# Patient Record
Sex: Male | Born: 1935 | Race: White | Hispanic: No | Marital: Married | Smoking: Former smoker
Health system: Southern US, Community
[De-identification: ages and names within clinical notes are randomized; demographics above are authoritative.]

## PROBLEM LIST (undated history)

## (undated) DIAGNOSIS — G20A1 Parkinson's disease without dyskinesia, without mention of fluctuations: Secondary | ICD-10-CM

## (undated) DIAGNOSIS — E079 Disorder of thyroid, unspecified: Secondary | ICD-10-CM

## (undated) DIAGNOSIS — G20 Parkinson's disease: Secondary | ICD-10-CM

## (undated) DIAGNOSIS — F0390 Unspecified dementia without behavioral disturbance: Secondary | ICD-10-CM

## (undated) HISTORY — DX: Unspecified dementia, unspecified severity, without behavioral disturbance, psychotic disturbance, mood disturbance, and anxiety: F03.90

## (undated) HISTORY — DX: Disorder of thyroid, unspecified: E07.9

---

## 1998-01-12 ENCOUNTER — Other Ambulatory Visit: Admission: RE | Admit: 1998-01-12 | Discharge: 1998-01-12 | Payer: Self-pay | Admitting: *Deleted

## 2000-02-23 ENCOUNTER — Other Ambulatory Visit: Admission: RE | Admit: 2000-02-23 | Discharge: 2000-02-23 | Payer: Self-pay | Admitting: Urology

## 2000-03-08 ENCOUNTER — Encounter: Admission: RE | Admit: 2000-03-08 | Discharge: 2000-03-08 | Payer: Self-pay | Admitting: Urology

## 2000-03-08 ENCOUNTER — Encounter: Payer: Self-pay | Admitting: Urology

## 2000-03-14 ENCOUNTER — Encounter: Payer: Self-pay | Admitting: Urology

## 2000-03-14 ENCOUNTER — Encounter: Admission: RE | Admit: 2000-03-14 | Discharge: 2000-03-14 | Payer: Self-pay | Admitting: Urology

## 2000-04-04 ENCOUNTER — Encounter: Admission: RE | Admit: 2000-04-04 | Discharge: 2000-07-03 | Payer: Self-pay | Admitting: Radiation Oncology

## 2000-07-04 ENCOUNTER — Encounter: Admission: RE | Admit: 2000-07-04 | Discharge: 2000-10-02 | Payer: Self-pay | Admitting: Radiation Oncology

## 2002-03-31 ENCOUNTER — Inpatient Hospital Stay (HOSPITAL_COMMUNITY): Admission: AD | Admit: 2002-03-31 | Discharge: 2002-04-01 | Payer: Self-pay | Admitting: Internal Medicine

## 2005-01-11 ENCOUNTER — Ambulatory Visit: Payer: Self-pay | Admitting: Internal Medicine

## 2005-05-11 ENCOUNTER — Ambulatory Visit: Payer: Self-pay | Admitting: Internal Medicine

## 2005-05-17 ENCOUNTER — Ambulatory Visit: Payer: Self-pay | Admitting: Internal Medicine

## 2005-10-25 ENCOUNTER — Ambulatory Visit: Payer: Self-pay | Admitting: Internal Medicine

## 2005-10-26 ENCOUNTER — Encounter: Admission: RE | Admit: 2005-10-26 | Discharge: 2005-10-26 | Payer: Self-pay | Admitting: Internal Medicine

## 2006-05-01 ENCOUNTER — Ambulatory Visit: Payer: Self-pay | Admitting: Internal Medicine

## 2006-09-03 ENCOUNTER — Ambulatory Visit: Payer: Self-pay | Admitting: Internal Medicine

## 2006-09-04 ENCOUNTER — Ambulatory Visit (HOSPITAL_BASED_OUTPATIENT_CLINIC_OR_DEPARTMENT_OTHER): Admission: RE | Admit: 2006-09-04 | Discharge: 2006-09-04 | Payer: Self-pay | Admitting: Internal Medicine

## 2006-09-09 ENCOUNTER — Ambulatory Visit: Payer: Self-pay | Admitting: Internal Medicine

## 2006-09-17 ENCOUNTER — Ambulatory Visit: Payer: Self-pay | Admitting: Internal Medicine

## 2006-09-27 ENCOUNTER — Ambulatory Visit: Payer: Self-pay | Admitting: Internal Medicine

## 2006-09-28 ENCOUNTER — Ambulatory Visit: Payer: Self-pay | Admitting: Internal Medicine

## 2006-09-28 LAB — CONVERTED CEMR LAB
BUN: 9 mg/dL (ref 6–23)
Basophils Absolute: 0 10*3/uL (ref 0.0–0.1)
CO2: 33 meq/L — ABNORMAL HIGH (ref 19–32)
Calcium: 9.3 mg/dL (ref 8.4–10.5)
Chloride: 103 meq/L (ref 96–112)
Creatinine, Ser: 1.2 mg/dL (ref 0.4–1.5)
Folate: 5.7 ng/mL
HCT: 42.7 % (ref 39.0–52.0)
Hemoglobin: 14.9 g/dL (ref 13.0–17.0)
MCHC: 35 g/dL (ref 30.0–36.0)
Monocytes Relative: 5.9 % (ref 3.0–11.0)
Neutro Abs: 3.7 10*3/uL (ref 1.4–7.7)
Potassium: 3.8 meq/L (ref 3.5–5.1)
RBC: 4.62 M/uL (ref 4.22–5.81)
RDW: 12.4 % (ref 11.5–14.6)

## 2006-10-02 ENCOUNTER — Ambulatory Visit: Payer: Self-pay | Admitting: Internal Medicine

## 2006-10-10 ENCOUNTER — Inpatient Hospital Stay (HOSPITAL_COMMUNITY): Admission: EM | Admit: 2006-10-10 | Discharge: 2006-10-17 | Payer: Self-pay | Admitting: Emergency Medicine

## 2006-10-11 ENCOUNTER — Ambulatory Visit: Payer: Self-pay | Admitting: Internal Medicine

## 2010-09-04 ENCOUNTER — Encounter: Payer: Self-pay | Admitting: Internal Medicine

## 2010-11-05 ENCOUNTER — Emergency Department (HOSPITAL_COMMUNITY)
Admission: EM | Admit: 2010-11-05 | Discharge: 2010-11-05 | Disposition: A | Discharge status: Home / Self Care | Payer: Medicare Other | Attending: Emergency Medicine | Admitting: Emergency Medicine

## 2010-11-05 DIAGNOSIS — Y9210 Unspecified residential institution as the place of occurrence of the external cause: Secondary | ICD-10-CM | POA: Insufficient documentation

## 2010-11-05 DIAGNOSIS — Z139 Encounter for screening, unspecified: Secondary | ICD-10-CM | POA: Insufficient documentation

## 2010-11-05 DIAGNOSIS — M1990 Unspecified osteoarthritis, unspecified site: Secondary | ICD-10-CM | POA: Insufficient documentation

## 2010-11-05 DIAGNOSIS — W06XXXA Fall from bed, initial encounter: Secondary | ICD-10-CM | POA: Insufficient documentation

## 2010-11-05 DIAGNOSIS — G309 Alzheimer's disease, unspecified: Secondary | ICD-10-CM | POA: Insufficient documentation

## 2010-11-05 DIAGNOSIS — G3183 Dementia with Lewy bodies: Secondary | ICD-10-CM | POA: Insufficient documentation

## 2010-11-05 DIAGNOSIS — E039 Hypothyroidism, unspecified: Secondary | ICD-10-CM | POA: Insufficient documentation

## 2010-11-05 DIAGNOSIS — F3289 Other specified depressive episodes: Secondary | ICD-10-CM | POA: Insufficient documentation

## 2010-11-05 DIAGNOSIS — F329 Major depressive disorder, single episode, unspecified: Secondary | ICD-10-CM | POA: Insufficient documentation

## 2010-11-05 DIAGNOSIS — F0280 Dementia in other diseases classified elsewhere without behavioral disturbance: Secondary | ICD-10-CM | POA: Insufficient documentation

## 2010-11-05 DIAGNOSIS — C61 Malignant neoplasm of prostate: Secondary | ICD-10-CM | POA: Insufficient documentation

## 2010-11-05 DIAGNOSIS — I10 Essential (primary) hypertension: Secondary | ICD-10-CM | POA: Insufficient documentation

## 2011-05-27 ENCOUNTER — Emergency Department (HOSPITAL_COMMUNITY)
Admission: EM | Admit: 2011-05-27 | Discharge: 2011-05-28 | Disposition: A | Discharge status: Home / Self Care | Payer: Medicare Other | Attending: Emergency Medicine | Admitting: Emergency Medicine

## 2011-05-27 DIAGNOSIS — I10 Essential (primary) hypertension: Secondary | ICD-10-CM | POA: Insufficient documentation

## 2011-05-27 DIAGNOSIS — L0291 Cutaneous abscess, unspecified: Secondary | ICD-10-CM | POA: Insufficient documentation

## 2011-05-27 DIAGNOSIS — L0390 Cellulitis, unspecified: Secondary | ICD-10-CM | POA: Insufficient documentation

## 2011-05-27 DIAGNOSIS — G309 Alzheimer's disease, unspecified: Secondary | ICD-10-CM | POA: Insufficient documentation

## 2011-05-27 DIAGNOSIS — Z8546 Personal history of malignant neoplasm of prostate: Secondary | ICD-10-CM | POA: Insufficient documentation

## 2011-05-27 DIAGNOSIS — S82899A Other fracture of unspecified lower leg, initial encounter for closed fracture: Secondary | ICD-10-CM | POA: Insufficient documentation

## 2011-05-27 DIAGNOSIS — F0280 Dementia in other diseases classified elsewhere without behavioral disturbance: Secondary | ICD-10-CM | POA: Insufficient documentation

## 2011-05-27 DIAGNOSIS — E039 Hypothyroidism, unspecified: Secondary | ICD-10-CM | POA: Insufficient documentation

## 2011-05-27 DIAGNOSIS — X58XXXA Exposure to other specified factors, initial encounter: Secondary | ICD-10-CM | POA: Insufficient documentation

## 2011-05-27 DIAGNOSIS — Z66 Do not resuscitate: Secondary | ICD-10-CM | POA: Insufficient documentation

## 2011-05-27 DIAGNOSIS — G3183 Dementia with Lewy bodies: Secondary | ICD-10-CM | POA: Insufficient documentation

## 2011-05-27 DIAGNOSIS — Y9210 Unspecified residential institution as the place of occurrence of the external cause: Secondary | ICD-10-CM | POA: Insufficient documentation

## 2011-05-27 LAB — BASIC METABOLIC PANEL
Chloride: 99 mEq/L (ref 96–112)
Creatinine, Ser: 0.79 mg/dL (ref 0.50–1.35)
GFR calc Af Amer: >90 mL/min (ref >90)
GFR calc non Af Amer: 80 mL/min — ABNORMAL LOW (ref >90)
Sodium: 136 mEq/L (ref 135–145)

## 2011-05-27 LAB — DIFFERENTIAL
Eosinophils Relative: 2 % (ref 0–5)
Lymphs Abs: 2.1 10*3/uL (ref 0.7–4.0)
Monocytes Absolute: 0.6 10*3/uL (ref 0.1–1.0)
Neutro Abs: 4.1 10*3/uL (ref 1.7–7.7)

## 2011-05-27 LAB — CBC
MCH: 31 pg (ref 26.0–34.0)
RBC: 4.36 MIL/uL (ref 4.22–5.81)
RDW: 13.1 % (ref 11.5–15.5)
WBC: 7 10*3/uL (ref 4.0–10.5)

## 2012-03-21 SURGERY — Surgical Case
Anesthesia: *Unknown

## 2012-12-02 ENCOUNTER — Non-Acute Institutional Stay (SKILLED_NURSING_FACILITY): Payer: Medicare Other | Admitting: Internal Medicine

## 2012-12-02 DIAGNOSIS — K5900 Constipation, unspecified: Secondary | ICD-10-CM

## 2012-12-02 DIAGNOSIS — F0390 Unspecified dementia without behavioral disturbance: Secondary | ICD-10-CM

## 2012-12-02 DIAGNOSIS — E039 Hypothyroidism, unspecified: Secondary | ICD-10-CM

## 2012-12-02 DIAGNOSIS — G20 Parkinson's disease: Secondary | ICD-10-CM

## 2012-12-12 NOTE — Progress Notes (Signed)
PROGRESS NOTE  DATE: 12-02-12  FACILITY: [facility]  LEVEL OF CARE: SNF  Routine Visit  CHIEF COMPLAINT:  Manage hypothyroidism and dementia  HISTORY OF PRESENT ILLNESS:  REASSESSMENT OF ONGOING PROBLEM(S):  1. HYPOTHYROIDISM: The hypothyroidism remains stable. No complications noted from the medications presently being used.  The staff deny fatigue or constipation.  Last TSH 15.78 in 4/14.  2. DEMENTIA: The dementia remaines stable and continues to function adequately in the current living environment with supervision.  The patient has had little changes in behavior. No complications noted from the medications presently being used. Patient is a poor historian.  PAST MEDICAL HISTORY : Reviewed.  No changes.  CURRENT MEDICATIONS: Reviewed per [facility]  REVIEW OF SYSTEMS: Unobtainable secondary to dementia.  PHYSICAL EXAMINATION  VS:  T 98       P      RR 18      BP 146/72     POX %     WT (Lb) 164  GENERAL: no acute distress, normal body habitus NECK: supple, trachea midline, no neck masses, no thyroid tenderness, no thyromegaly RESPIRATORY: breathing is even & unlabored, BS CTAB CARDIAC: RRR, no murmur,no extra heart sounds, no edema GI: abdomen soft, normal BS, no masses, no tenderness, no hepatomegaly, no splenomegaly PSYCHIATRIC: the patient is alert & disoriented, affect & behavior appropriate  LABS/RADIOLOGY:  4/14 BMP normal, CBC normal, liver profile normal, Depakote 28  ASSESSMENT/PLAN:  1. hypothyroidism-uncontrolled. synthroid was increased. Recheck TSH is pending. 2. dementia-advanced. 3. Parkinson's disease-continue Sinemet. 4. constipation-well-controlled. 5. depression-continue current antidepressant.   CPT CODE: 81191

## 2012-12-23 ENCOUNTER — Non-Acute Institutional Stay (SKILLED_NURSING_FACILITY): Payer: Medicare Other | Admitting: Internal Medicine

## 2012-12-23 DIAGNOSIS — G20 Parkinson's disease: Secondary | ICD-10-CM | POA: Insufficient documentation

## 2012-12-23 DIAGNOSIS — K5900 Constipation, unspecified: Secondary | ICD-10-CM

## 2012-12-23 DIAGNOSIS — G309 Alzheimer's disease, unspecified: Secondary | ICD-10-CM | POA: Insufficient documentation

## 2012-12-23 DIAGNOSIS — E039 Hypothyroidism, unspecified: Secondary | ICD-10-CM | POA: Insufficient documentation

## 2012-12-23 DIAGNOSIS — G20A1 Parkinson's disease without dyskinesia, without mention of fluctuations: Secondary | ICD-10-CM | POA: Insufficient documentation

## 2012-12-23 NOTE — Progress Notes (Signed)
PROGRESS NOTE  DATE: 12-23-12  FACILITY: [facility]  LEVEL OF CARE: SNF  Routine Visit  CHIEF COMPLAINT:  Manage hypothyroidism and dementia  HISTORY OF PRESENT ILLNESS:  REASSESSMENT OF ONGOING PROBLEM(S):  1. HYPOTHYROIDISM: The hypothyroidism remains stable. No complications noted from the medications presently being used.  The staff deny fatigue or constipation.  Last TSH 15.78 in 4/14.  2. DEMENTIA: The dementia remaines stable and continues to function adequately in the current living environment with supervision.  The patient has had little changes in behavior. No complications noted from the medications presently being used. Patient is a poor historian.  PAST MEDICAL HISTORY : Reviewed.  No changes.  CURRENT MEDICATIONS: Reviewed per [facility]  REVIEW OF SYSTEMS: Unobtainable secondary to dementia.  PHYSICAL EXAMINATION  VS:  T 97.3       P71      RR 18      BP 152/58     POX %     WT (Lb) 165  GENERAL: no acute distress, normal body habitus NECK: supple, trachea midline, no neck masses, no thyroid tenderness, no thyromegaly RESPIRATORY: breathing is even & unlabored, BS CTAB CARDIAC: RRR, no murmur,no extra heart sounds, no edema GI: abdomen soft, normal BS, no masses, no tenderness, no hepatomegaly, no splenomegaly PSYCHIATRIC: the patient is alert & disoriented, affect & behavior appropriate  LABS/RADIOLOGY:  4/14 BMP normal, CBC normal, liver profile normal, Depakote 28  ASSESSMENT/PLAN:  1. hypothyroidism-uncontrolled. synthroid was increased. Recheck TSH is pending. 2. dementia-advanced. 3. Parkinson's disease-continue Sinemet. 4. constipation-well-controlled. 5. depression-continue current antidepressant. 6. elevated blood pressure-we'll review of BP log.   CPT CODE: 04540

## 2013-01-20 ENCOUNTER — Non-Acute Institutional Stay (SKILLED_NURSING_FACILITY): Payer: Medicare Other | Admitting: Internal Medicine

## 2013-01-20 DIAGNOSIS — G20 Parkinson's disease: Secondary | ICD-10-CM

## 2013-01-20 DIAGNOSIS — K5900 Constipation, unspecified: Secondary | ICD-10-CM

## 2013-01-20 DIAGNOSIS — G309 Alzheimer's disease, unspecified: Secondary | ICD-10-CM

## 2013-01-20 DIAGNOSIS — E039 Hypothyroidism, unspecified: Secondary | ICD-10-CM

## 2013-01-20 DIAGNOSIS — F0280 Dementia in other diseases classified elsewhere without behavioral disturbance: Secondary | ICD-10-CM

## 2013-01-23 NOTE — Progress Notes (Signed)
PROGRESS NOTE  DATE: 01-20-13  FACILITY: [facility]  LEVEL OF CARE: SNF  Routine Visit  CHIEF COMPLAINT:  Manage hypothyroidism and dementia  HISTORY OF PRESENT ILLNESS:  REASSESSMENT OF ONGOING PROBLEM(S):  1. HYPOTHYROIDISM: The hypothyroidism remains stable. No complications noted from the medications presently being used.  The staff deny fatigue or constipation.  Last TSH 15.78 in 4/14, in 5/14 TSH 18.21.  2. DEMENTIA: The dementia remaines stable and continues to function adequately in the current living environment with supervision.  The patient has had little changes in behavior. No complications noted from the medications presently being used. Patient is a poor historian.  PAST MEDICAL HISTORY : Reviewed.  No changes.  CURRENT MEDICATIONS: Reviewed per [facility]  REVIEW OF SYSTEMS: Unobtainable secondary to dementia.  PHYSICAL EXAMINATION  VS:  T 98.7.       P 84      RR 18      BP 120/70    POX %     WT (Lb) 160  GENERAL: no acute distress, normal body habitus NECK: supple, trachea midline, no neck masses, no thyroid tenderness, no thyromegaly RESPIRATORY: breathing is even & unlabored, BS CTAB CARDIAC: RRR, no murmur,no extra heart sounds, no edema GI: abdomen soft, normal BS, no masses, no tenderness, no hepatomegaly, no splenomegaly PSYCHIATRIC: the patient is alert & disoriented, affect & behavior appropriate  LABS/RADIOLOGY:  4/14 BMP normal, CBC normal, liver profile normal, Depakote 28  ASSESSMENT/PLAN:  1. hypothyroidism-uncontrolled. synthroid was increased. Recheck TSH is pending. 2. dementia-advanced. 3. Parkinson's disease-continue Sinemet. 4. constipation-well-controlled. 5. depression-continue current antidepressant.   CPT CODE: 16109

## 2013-03-05 ENCOUNTER — Non-Acute Institutional Stay (SKILLED_NURSING_FACILITY): Payer: Medicare Other | Admitting: Internal Medicine

## 2013-03-05 DIAGNOSIS — K5900 Constipation, unspecified: Secondary | ICD-10-CM

## 2013-03-05 DIAGNOSIS — G309 Alzheimer's disease, unspecified: Secondary | ICD-10-CM

## 2013-03-05 DIAGNOSIS — F0280 Dementia in other diseases classified elsewhere without behavioral disturbance: Secondary | ICD-10-CM

## 2013-03-05 DIAGNOSIS — G20 Parkinson's disease: Secondary | ICD-10-CM

## 2013-03-05 DIAGNOSIS — E039 Hypothyroidism, unspecified: Secondary | ICD-10-CM

## 2013-03-05 NOTE — Progress Notes (Signed)
PROGRESS NOTE  DATE: 03-05-13  FACILITY: [facility]  LEVEL OF CARE: SNF  Routine Visit  CHIEF COMPLAINT:  Manage hypothyroidism and dementia  HISTORY OF PRESENT ILLNESS:  REASSESSMENT OF ONGOING PROBLEM(S):  HYPOTHYROIDISM: The hypothyroidism remains stable. No complications noted from the medications presently being used.  The staff deny fatigue or constipation.  Last TSH 15.78 in 4/14, in 5/14 TSH 18.21.  DEMENTIA: The dementia remaines stable and continues to function adequately in the current living environment with supervision.  The patient has had little changes in behavior. No complications noted from the medications presently being used. Patient is a poor historian.  PAST MEDICAL HISTORY : Reviewed.  No changes.  CURRENT MEDICATIONS: Reviewed per [facility]  REVIEW OF SYSTEMS: Unobtainable secondary to dementia.  PHYSICAL EXAMINATION  VS:  T 98.      P 78      RR 20      BP 120/60    POX % 97    WT (Lb) 163  GENERAL: no acute distress, normal body habitus NECK: supple, trachea midline, no neck masses, no thyroid tenderness, no thyromegaly RESPIRATORY: breathing is even & unlabored, BS CTAB CARDIAC: RRR, no murmur,no extra heart sounds, no edema GI: abdomen soft, normal BS, no masses, no tenderness, no hepatomegaly, no splenomegaly PSYCHIATRIC: the patient is alert & disoriented, affect & behavior appropriate  LABS/RADIOLOGY:  4/14 BMP normal, CBC normal, liver profile normal, Depakote 28  ASSESSMENT/PLAN:  1. hypothyroidism-uncontrolled. synthroid was increased. Recheck TSH. 2. dementia-advanced. 3. Parkinson's disease-continue Sinemet. 4. constipation-well-controlled. 5. depression-continue current antidepressant.   CPT CODE: 16109

## 2013-03-26 ENCOUNTER — Non-Acute Institutional Stay (SKILLED_NURSING_FACILITY): Payer: Medicare Other | Admitting: Internal Medicine

## 2013-03-26 DIAGNOSIS — E039 Hypothyroidism, unspecified: Secondary | ICD-10-CM

## 2013-03-26 DIAGNOSIS — K5900 Constipation, unspecified: Secondary | ICD-10-CM

## 2013-03-26 DIAGNOSIS — F0280 Dementia in other diseases classified elsewhere without behavioral disturbance: Secondary | ICD-10-CM

## 2013-03-26 DIAGNOSIS — G309 Alzheimer's disease, unspecified: Secondary | ICD-10-CM

## 2013-03-26 DIAGNOSIS — G20 Parkinson's disease: Secondary | ICD-10-CM

## 2013-03-26 DIAGNOSIS — G20A1 Parkinson's disease without dyskinesia, without mention of fluctuations: Secondary | ICD-10-CM

## 2013-03-28 NOTE — Progress Notes (Signed)
PROGRESS NOTE  DATE: 03-26-13  FACILITY: [facility]  LEVEL OF CARE: SNF  Routine Visit  CHIEF COMPLAINT:  Manage hypothyroidism and dementia  HISTORY OF PRESENT ILLNESS:  REASSESSMENT OF ONGOING PROBLEM(S):  HYPOTHYROIDISM: The hypothyroidism remains stable. No complications noted from the medications presently being used.  The staff deny fatigue or constipation.  Last TSH 15.78 in 4/14, in 5/14 TSH 18.21, in 7/14 TSH 87.44  DEMENTIA: The dementia remaines stable and continues to function adequately in the current living environment with supervision.  The patient has had little changes in behavior. No complications noted from the medications presently being used. Patient is a poor historian.  PAST MEDICAL HISTORY : Reviewed.  No changes.  CURRENT MEDICATIONS: Reviewed per [facility]  REVIEW OF SYSTEMS: Unobtainable secondary to dementia.  PHYSICAL EXAMINATION  VS:  T 98.4      P 70      RR 18      BP 104/64    POX    WT (Lb) 158  GENERAL: no acute distress, normal body habitus NECK: supple, trachea midline, no neck masses, no thyroid tenderness, no thyromegaly RESPIRATORY: breathing is even & unlabored, BS CTAB CARDIAC: RRR, no murmur,no extra heart sounds, no edema GI: abdomen soft, normal BS, no masses, no tenderness, no hepatomegaly, no splenomegaly PSYCHIATRIC: the patient is alert & disoriented, affect & behavior appropriate  LABS/RADIOLOGY:  4/14 BMP normal, CBC normal, liver profile normal, Depakote 28  ASSESSMENT/PLAN:  hypothyroidism-uncontrolled. synthroid was increased. Recheck pending. dementia-advanced. Parkinson's disease-continue Sinemet. constipation-well-controlled. depression-continue current antidepressant.   CPT CODE: 16109

## 2013-04-09 ENCOUNTER — Non-Acute Institutional Stay (SKILLED_NURSING_FACILITY): Payer: Medicare Other | Admitting: Adult Health

## 2013-04-09 DIAGNOSIS — E039 Hypothyroidism, unspecified: Secondary | ICD-10-CM

## 2013-05-02 ENCOUNTER — Other Ambulatory Visit: Payer: Self-pay | Admitting: Family Medicine

## 2013-05-05 ENCOUNTER — Non-Acute Institutional Stay (SKILLED_NURSING_FACILITY): Payer: Medicare Other | Admitting: Internal Medicine

## 2013-05-05 DIAGNOSIS — F0280 Dementia in other diseases classified elsewhere without behavioral disturbance: Secondary | ICD-10-CM

## 2013-05-05 DIAGNOSIS — E039 Hypothyroidism, unspecified: Secondary | ICD-10-CM

## 2013-05-05 DIAGNOSIS — K5900 Constipation, unspecified: Secondary | ICD-10-CM

## 2013-05-05 DIAGNOSIS — G20 Parkinson's disease: Secondary | ICD-10-CM

## 2013-05-05 NOTE — Progress Notes (Signed)
PROGRESS NOTE  DATE: 05-05-13  FACILITY: [facility]  LEVEL OF CARE: SNF  Routine Visit  CHIEF COMPLAINT:  Manage hypothyroidism and dementia  HISTORY OF PRESENT ILLNESS:  REASSESSMENT OF ONGOING PROBLEM(S):  HYPOTHYROIDISM: The hypothyroidism remains stable. No complications noted from the medications presently being used.  The staff deny fatigue or constipation.  Last TSH 15.78 in 4/14, in 5/14 TSH 18.21, in 7/14 TSH 87.44, in 7-14 TSH 83.11.  DEMENTIA: The dementia remaines stable and continues to function adequately in the current living environment with supervision.  The patient has had little changes in behavior. No complications noted from the medications presently being used. Patient is a poor historian.  PAST MEDICAL HISTORY : Reviewed.  No changes.  CURRENT MEDICATIONS: Reviewed per [facility]  REVIEW OF SYSTEMS: Unobtainable secondary to dementia.  PHYSICAL EXAMINATION  VS:  T 96.5      P 56     RR 18      BP 153/72    POX    WT (Lb) 153  GENERAL: no acute distress, normal body habitus NECK: supple, trachea midline, no neck masses, no thyroid tenderness, no thyromegaly RESPIRATORY: breathing is even & unlabored, BS CTAB CARDIAC: RRR, no murmur,no extra heart sounds, no edema GI: abdomen soft, normal BS, no masses, no tenderness, no hepatomegaly, no splenomegaly PSYCHIATRIC: the patient is alert & disoriented, affect & behavior appropriate  LABS/RADIOLOGY:  4/14 BMP normal, CBC normal, liver profile normal, Depakote 28  ASSESSMENT/PLAN:  hypothyroidism-uncontrolled. synthroid was increased. Recheck pending. dementia-advanced. Parkinson's disease-continue Sinemet. constipation-well-controlled. depression-continue current antidepressant. Elevated blood pressure-will review a log   CPT CODE: 91478

## 2013-05-14 ENCOUNTER — Encounter: Payer: Self-pay | Admitting: Family

## 2013-05-14 ENCOUNTER — Non-Acute Institutional Stay (SKILLED_NURSING_FACILITY): Payer: Medicare Other | Admitting: Family

## 2013-05-14 DIAGNOSIS — E039 Hypothyroidism, unspecified: Secondary | ICD-10-CM

## 2013-05-14 MED ORDER — LEVOTHYROXINE SODIUM 200 MCG PO TABS: 300.0000 ug | ORAL_TABLET | Freq: Every day | ORAL | Status: DC

## 2013-05-14 NOTE — Progress Notes (Signed)
Patient ID: Phillip Gutierrez, male   DOB: 05-Mar-1924, 79 y.o.   MRN: [id number]  Date: 05/14/13  Facility: [facility]  Code Status:  @emerg @  Chief Complaint  Patient presents with  . Acute Visit    Uncontrolled hypothryroidism/elevated TSH    HPI: Pt presents with elevated TSH 46.2 and T4 0.69 .  Pt prior TSH >80 02/24/13. Nursing staff reports that patient often refuses synthroid. There are no further issues or concerns voiced by the patient and or staff at present time.         Medication List       This list is accurate as of: 05/14/13  9:11 PM.  Always use your most recent med list.               levothyroxine 200 MCG tablet  Commonly known as:  SYNTHROID, LEVOTHROID  Take 250 mcg by mouth daily before breakfast.         DATA REVIEWED  Laboratory Studies: 05/06/13-TSH 46.53/ T4 0.69     Past Medical History  Diagnosis Date  . Thyroid disease   . Dementia    History   Social History  . Marital Status: Married    Spouse Name: N/A    Number of Children: N/A  . Years of Education: N/A   Occupational History  . Not on file.   Social History Main Topics  . Smoking status: Not on file  . Smokeless tobacco: Not on file  . Alcohol Use: Not on file  . Drug Use: Not on file  . Sexual Activity: Not on file   Other Topics Concern  . Not on file   Social History Narrative  . No narrative on file   Review of Systems  Unable to perform ROS    Physical Exam Filed Vitals:   05/14/13 2059  BP: 156/82  Pulse: 62  Temp: 97.7 F (36.5 C)  Resp: 18   There is no height or weight on file to calculate BMI. Physical Exam  Constitutional: He appears lethargic. No distress.  Dressed and groomed appropriately; in supine position in facility bed. Safety measures in place  HENT:  Mouth/Throat: Oropharynx is clear and moist.  Eyes: Pupils are equal, round, and reactive to light.  Neck: No thyromegaly present.  Cardiovascular: Normal rate and regular rhythm.    Pulmonary/Chest: Effort normal and breath sounds normal.  Neurological: He appears lethargic.  Oriented to self only    ASSESSMENT/PLAN  Hypothyroidism-Increased Synthroid to 300 mcg/day; Education provided to nursing staff and med tech regarding the administration of Synthroid. Order for free T3 and T4 in 6 weeks  Follow up:prn

## 2013-06-10 ENCOUNTER — Non-Acute Institutional Stay (SKILLED_NURSING_FACILITY): Payer: Medicare Other | Admitting: Internal Medicine

## 2013-06-10 DIAGNOSIS — E039 Hypothyroidism, unspecified: Secondary | ICD-10-CM

## 2013-07-04 NOTE — Progress Notes (Signed)
Patient ID: Phillip Gutierrez, male   DOB: 03/07/1924, 79 y.o.   MRN: [id number]        PROGRESS NOTE  DATE: 06/10/2013  FACILITY:  [facility]  LEVEL OF CARE: SNF (31)  Acute Visit  CHIEF COMPLAINT:  Manage hypothyroidism.    HISTORY OF PRESENT ILLNESS: I was requested by the staff to assess the patient regarding above problem(s):  HYPOTHYROIDISM: The hypothyroidism is unstable. No complications noted from the medications presently being used.  The staff denies fatigue or constipation.  Last TSH:  On 06/06/2013:  TSH 24.87.  On 05/06/2013:  TSH 46.52.  Patient is a poor historian due to dementia.    PAST MEDICAL HISTORY : Reviewed.  No changes.  CURRENT MEDICATIONS: Reviewed per [facility]  REVIEW OF SYSTEMS:  Unobtainable due to dementia.        PHYSICAL EXAMINATION  GENERAL: no acute distress, normal body habitus NECK: supple, trachea midline, no neck masses, no thyroid tenderness, no thyromegaly RESPIRATORY: breathing is even & unlabored, BS CTAB CARDIAC: RRR, no murmur,no extra heart sounds, no edema GI: abdomen soft, normal BS, no masses, no tenderness, no hepatomegaly, no splenomegaly PSYCHIATRIC: the patient is alert, disoriented, affect & behavior appropriate  ASSESSMENT/PLAN:  Hypothyroidism.  Uncontrolled problem.  Synthroid was increased to 300 mcg q.d. on 05/14/2013 and TSH has responded.   We will recheck another TSH in four weeks.  Patient is asymptomatic.    CPT CODE: 40981

## 2013-07-08 ENCOUNTER — Encounter: Payer: Self-pay | Admitting: Internal Medicine

## 2013-07-08 ENCOUNTER — Non-Acute Institutional Stay (SKILLED_NURSING_FACILITY): Payer: Medicare Other | Admitting: Internal Medicine

## 2013-07-08 DIAGNOSIS — K5900 Constipation, unspecified: Secondary | ICD-10-CM

## 2013-07-08 DIAGNOSIS — E039 Hypothyroidism, unspecified: Secondary | ICD-10-CM

## 2013-07-08 DIAGNOSIS — G20 Parkinson's disease: Secondary | ICD-10-CM

## 2013-07-08 DIAGNOSIS — F0280 Dementia in other diseases classified elsewhere without behavioral disturbance: Secondary | ICD-10-CM

## 2013-07-08 NOTE — Progress Notes (Signed)
PROGRESS NOTE  DATE: 07-08-13  FACILITY: [facility]  LEVEL OF CARE: SNF  Routine Visit  CHIEF COMPLAINT:  Manage hypothyroidism and dementia  HISTORY OF PRESENT ILLNESS:  REASSESSMENT OF ONGOING PROBLEM(S):  HYPOTHYROIDISM: The hypothyroidism remains stable. No complications noted from the medications presently being used.  The staff deny fatigue or constipation.  Last TSH 15.78 in 4/14, in 5/14 TSH 18.21, in 7/14 TSH 87.44, in 7-14 TSH 83.11, in 11- 14 TSH 4.34.  DEMENTIA: The dementia remaines stable and continues to function adequately in the current living environment with supervision.  The patient has had little changes in behavior. No complications noted from the medications presently being used. Patient is a poor historian.  PAST MEDICAL HISTORY : Reviewed.  No changes.  CURRENT MEDICATIONS: Reviewed per [facility]  REVIEW OF SYSTEMS: Unobtainable secondary to dementia.  PHYSICAL EXAMINATION  VS:  T 97.1      P 60    RR 16      BP 121/58    POX    WT (Lb) 156  GENERAL: no acute distress, normal body habitus NECK: supple, trachea midline, no neck masses, no thyroid tenderness, no thyromegaly RESPIRATORY: breathing is even & unlabored, BS CTAB CARDIAC: RRR, no murmur,no extra heart sounds, no edema GI: abdomen soft, normal BS, no masses, no tenderness, no hepatomegaly, no splenomegaly PSYCHIATRIC: the patient is alert & disoriented, affect & behavior appropriate  LABS/RADIOLOGY:  10- 14 CBC normal, total protein 5.9 otherwise CMP normal, Depakote level 17  4/14 BMP normal, CBC normal, liver profile normal, Depakote 28  ASSESSMENT/PLAN:  hypothyroidism-well controlled dementia-advanced. Parkinson's disease-continue Sinemet. constipation-well-controlled. depression-continue current antidepressant.  CPT CODE: 30865

## 2013-07-15 ENCOUNTER — Non-Acute Institutional Stay (SKILLED_NURSING_FACILITY): Payer: Medicare Other | Admitting: Internal Medicine

## 2013-07-15 DIAGNOSIS — E039 Hypothyroidism, unspecified: Secondary | ICD-10-CM

## 2013-07-24 NOTE — Progress Notes (Signed)
Patient ID: Phillip Gutierrez, male   DOB: November 01, 1923, 79 y.o.   MRN: [id number]     [facility]   No Known Allergies   Chief Complaint  Patient presents with  . Acute Visit    follow up labs    HPI  He is being seen to follow up with his lab results. His tsh is elevated at 83.110. He will require to have his synthroid increased; he is presently taking synthroid 200 mcg daily. There are no concerns being voiced by the nursing staff at this time.   Past Medical History  Diagnosis Date  . Thyroid disease   . Dementia     No past surgical history on file.  MEDICATIONS  lexapro 15 mg daily Synthroid 200 mcg daily Tylenol 500 mg twice daily depakote 375 mg twice daily Sinemet 25/200 mg tid Senna daily  Filed Vitals:   04/09/13 1028  BP: 128/66  Pulse: 64  Height: 5\' 9"  (1.753 m)  Weight: 153 lb (69.4 kg)    LABS REVIEWED;   11-14-12: wbc 5.4;hgb 13.8; hct 40.7; mcv 92; plt 159; glucose 100; bun 11; creat 0.62; k+4.7; na++150 Liver normal albumin 4.1; tsh 15.780; depakote 28 11-18-12: glucose 91; bun 9; creat 0.67; k+3.8; na+= 145 12-23-12: tsh 18.210 02-25-13: 87.440 04-08-13: 83.110   Review of Systems  Unable to perform ROS   Physical Exam  Constitutional: No distress.  thin  Neck: Neck supple. No JVD present. No thyromegaly present.  Cardiovascular: Normal rate and regular rhythm.   Respiratory: Effort normal and breath sounds normal. No respiratory distress.  GI: Soft. Bowel sounds are normal. He exhibits no distension.  Musculoskeletal: He exhibits no edema.  Neurological: He is alert.  Skin: Skin is warm and dry. He is not diaphoretic.    ASSESSMENT/PLAN  1. Hypothyroidism: will increase his synthroid to 250 mcg daily; in one month will check tsh free t4; free t3; thyroid antibodies and thyroglobulin due to his increasing tsh despite increasing synthroid dose may need further investigation.

## 2013-07-29 ENCOUNTER — Encounter: Payer: Self-pay | Admitting: Internal Medicine

## 2013-07-29 ENCOUNTER — Non-Acute Institutional Stay (SKILLED_NURSING_FACILITY): Payer: Medicare Other | Admitting: Internal Medicine

## 2013-07-29 DIAGNOSIS — K5900 Constipation, unspecified: Secondary | ICD-10-CM

## 2013-07-29 DIAGNOSIS — F0280 Dementia in other diseases classified elsewhere without behavioral disturbance: Secondary | ICD-10-CM

## 2013-07-29 DIAGNOSIS — E039 Hypothyroidism, unspecified: Secondary | ICD-10-CM

## 2013-07-29 DIAGNOSIS — G20 Parkinson's disease: Secondary | ICD-10-CM

## 2013-07-29 NOTE — Progress Notes (Signed)
PROGRESS NOTE  DATE: 07-29-13  FACILITY: [facility]  LEVEL OF CARE: SNF  Routine Visit  CHIEF COMPLAINT:  Manage hypothyroidism and dementia  HISTORY OF PRESENT ILLNESS:  REASSESSMENT OF ONGOING PROBLEM(S):  HYPOTHYROIDISM: The hypothyroidism remains stable. No complications noted from the medications presently being used.  The staff deny fatigue or constipation.  Last TSH 15.78 in 4/14, in 5/14 TSH 18.21, in 7/14 TSH 87.44, in 7-14 TSH 83.11, in 11- 14 TSH 4.34, then 10.44.  DEMENTIA: The dementia remaines stable and continues to function adequately in the current living environment with supervision.  The patient has had little changes in behavior. No complications noted from the medications presently being used. Patient is a poor historian.  PAST MEDICAL HISTORY : Reviewed.  No changes.  CURRENT MEDICATIONS: Reviewed per [facility]  REVIEW OF SYSTEMS: Unobtainable secondary to dementia.  PHYSICAL EXAMINATION  VS:  T 97.5     P 80    RR 20      BP 124/65   POX    WT (Lb) 150  GENERAL: no acute distress, normal body habitus NECK: supple, trachea midline, no neck masses, no thyroid tenderness, no thyromegaly RESPIRATORY: breathing is even & unlabored, BS CTAB CARDIAC: RRR, no murmur,no extra heart sounds, no edema GI: abdomen soft, normal BS, no masses, no tenderness, no hepatomegaly, no splenomegaly PSYCHIATRIC: the patient is alert & disoriented, affect & behavior appropriate  LABS/RADIOLOGY:  10- 14 CBC normal, total protein 5.9 otherwise CMP normal, Depakote level 17  4/14 BMP normal, CBC normal, liver profile normal, Depakote 28  ASSESSMENT/PLAN:  hypothyroidism-uncontrolled, but patient is on levothyroxine 300 mcg daily. Recheck TSH in 6 weeks. dementia-advanced. Parkinson's disease-continue Sinemet. constipation-well-controlled. depression-continue current antidepressant.  CPT CODE: 16109

## 2013-08-19 ENCOUNTER — Non-Acute Institutional Stay (SKILLED_NURSING_FACILITY): Payer: Medicare Other | Admitting: Internal Medicine

## 2013-08-19 DIAGNOSIS — G309 Alzheimer's disease, unspecified: Secondary | ICD-10-CM

## 2013-08-19 DIAGNOSIS — E039 Hypothyroidism, unspecified: Secondary | ICD-10-CM

## 2013-08-19 DIAGNOSIS — K5900 Constipation, unspecified: Secondary | ICD-10-CM

## 2013-08-19 DIAGNOSIS — G20 Parkinson's disease: Secondary | ICD-10-CM

## 2013-08-19 DIAGNOSIS — F0280 Dementia in other diseases classified elsewhere without behavioral disturbance: Secondary | ICD-10-CM

## 2013-08-19 NOTE — Progress Notes (Signed)
PROGRESS NOTE  DATE: 08-19-13  FACILITY: [facility]  LEVEL OF CARE: SNF  Routine Visit  CHIEF COMPLAINT:  Manage hypothyroidism and dementia  HISTORY OF PRESENT ILLNESS:  REASSESSMENT OF ONGOING PROBLEM(S):  HYPOTHYROIDISM: The hypothyroidism remains stable. No complications noted from the medications presently being used.  The staff deny fatigue or constipation.  Last TSH 15.78 in 4/14, in 5/14 TSH 18.21, in 7/14 TSH 87.44, in 7-14 TSH 83.11, in 11- 14 TSH 4.34, then 10.44.  DEMENTIA: The dementia remaines stable and continues to function adequately in the current living environment with supervision.  The patient has had little changes in behavior. No complications noted from the medications presently being used. Patient is a poor historian.  PAST MEDICAL HISTORY : Reviewed.  No changes.  CURRENT MEDICATIONS: Reviewed per [facility]  REVIEW OF SYSTEMS: Unobtainable secondary to dementia.  PHYSICAL EXAMINATION  VS:  T 98.1     P 59    RR 16      BP 129/72     WT (Lb) 150  GENERAL: no acute distress, normal body habitus NECK: supple, trachea midline, no neck masses, no thyroid tenderness, no thyromegaly RESPIRATORY: breathing is even & unlabored, BS CTAB CARDIAC: RRR, no murmur,no extra heart sounds, no edema GI: abdomen soft, normal BS, no masses, no tenderness, no hepatomegaly, no splenomegaly PSYCHIATRIC: the patient is alert & disoriented, affect & behavior appropriate  LABS/RADIOLOGY:  10- 14 CBC normal, total protein 5.9 otherwise CMP normal, Depakote level 17  4/14 BMP normal, CBC normal, liver profile normal, Depakote 28  ASSESSMENT/PLAN:  hypothyroidism-uncontrolled, recheck TSH dementia-advanced. Parkinson's disease-continue Sinemet. constipation-well-controlled. depression-continue current antidepressant.  CPT CODE: 43154

## 2013-09-01 ENCOUNTER — Encounter: Payer: Self-pay | Admitting: Internal Medicine

## 2013-09-01 NOTE — Progress Notes (Signed)
Patient ID: Phillip Gutierrez, male   DOB: 04-10-1924, 80 y.o.   MRN: [id number]          PROGRESS NOTE  DATE: 07/15/2013    FACILITY:  [facility]  LEVEL OF CARE: SNF (31)  Acute Visit  CHIEF COMPLAINT:  Manage hypothyroidism.    HISTORY OF PRESENT ILLNESS: I was requested by the staff to assess the patient regarding above problem(s):  HYPOTHYROIDISM: The hypothyroidism is unstable. No complications noted from the medications presently being used.  The staff denies fatigue or constipation.  Last TSH:  On 07/08/2013:  TSH 10.44.  PAST MEDICAL HISTORY : Reviewed.  No changes.  CURRENT MEDICATIONS: Reviewed per [facility]  REVIEW OF SYSTEMS:  Unobtainable due to dementia.    PHYSICAL EXAMINATION  GENERAL: no acute distress, normal body habitus NECK: supple, trachea midline, no neck masses, no thyroid tenderness, no thyromegaly RESPIRATORY: breathing is even & unlabored, BS CTAB CARDIAC: RRR, no murmur,no extra heart sounds, no edema GI: abdomen soft, normal BS, no masses, no tenderness, no hepatomegaly, no splenomegaly PSYCHIATRIC: the patient is alert & oriented to person, affect & behavior appropriate  ASSESSMENT/PLAN:  Hypothyroidism.  Unstable problem.  However, the patient is currently on levothyroxine 300 mcg q.d.  Therefore, request pharmacist to assess why patient is not responding to levothyroxine.  We will recheck another level in two weeks.    THN Metrics:   BP:  121/58.  Former smoker.  Not on aspirin.    CPT CODE: 56812

## 2013-09-02 ENCOUNTER — Non-Acute Institutional Stay (SKILLED_NURSING_FACILITY): Payer: Medicare Other | Admitting: Internal Medicine

## 2013-09-02 DIAGNOSIS — E039 Hypothyroidism, unspecified: Secondary | ICD-10-CM

## 2013-09-02 NOTE — Progress Notes (Signed)
         PROGRESS NOTE  DATE: 09/02/2013  FACILITY:  [facility]  LEVEL OF CARE: SNF (31)  Acute Visit  CHIEF COMPLAINT:  Manage hypothyroidism  HISTORY OF PRESENT ILLNESS: I was requested by the staff to assess the patient regarding above problem(s):  HYPOTHYROIDISM: The hypothyroidism is unstable. No complications noted from the medications presently being used.  The staff deny fatigue or constipation.  Last TSH 0.099 on 08-29-13. Patient does not follow commands due to dementia.  PAST MEDICAL HISTORY : Reviewed.  No changes.  CURRENT MEDICATIONS: Reviewed per [facility]  REVIEW OF SYSTEMS: Unobtainable due to dementia  PHYSICAL EXAMINATION  GENERAL: no acute distress, normal body habitus NECK: supple, trachea midline, no neck masses, no thyroid tenderness, no thyromegaly RESPIRATORY: breathing is even & unlabored, BS CTAB CARDIAC: RRR, no murmur,no extra heart sounds, no edema GI: abdomen soft, normal BS, no masses, no tenderness, no hepatomegaly, no splenomegaly PSYCHIATRIC: the patient is alert & disoriented affect & behavior appropriate  LABS/RADIOLOGY: See history of present illness  ASSESSMENT/PLAN:  Hypothyroidism-uncontrolled. Decrease levothyroxine to 250 mcg daily. Check TSH in 6 weeks.  CPT CODE: 66063

## 2013-10-24 ENCOUNTER — Non-Acute Institutional Stay (SKILLED_NURSING_FACILITY): Payer: Medicare Other | Admitting: Internal Medicine

## 2013-10-24 DIAGNOSIS — E039 Hypothyroidism, unspecified: Secondary | ICD-10-CM

## 2013-10-28 NOTE — Progress Notes (Signed)
Patient ID: Phillip Gutierrez, male   DOB: 1923-08-15, 80 y.o.   MRN: [id number]                   PROGRESS NOTE  DATE:  10/24/2013    FACILITY: [facility]    LEVEL OF CARE:   SNF   Acute Visit   CHIEF COMPLAINT:  Follow-up of hypothyroidism.    HISTORY OF PRESENT ILLNESS:  This is a man with advanced dementia with a history of Parkinson's disease, agitation, and violent behavior.  He has been in the building since 2008.  He shares a room with his wife.    He also has hypothyroidism.  A TSH on 08/29/2013 was measured at 0.099.  This was, I believe, on 275 mcg a day.  His Synthroid was appropriately reduced to 250.  Follow-up TSH from 10/13/2013 showed 24.36.  It is unlikely in my mind that the corrective approach here is to simply increase the Synthroid.    In discussion with the staff, it would appear that there were four days where the medication could not be given in February.  There were already two in March.  They have already adjusted the timing of the Synthroid to 11:30.  They are already crushing this, giving it surreptitiously, etc.      PHYSICAL EXAMINATION:  Exam is very difficult in this man.  He has violent tremors and he really becomes combative with any attempt at examining him.   CARDIOVASCULAR:  CARDIAC:   Heart sounds are normal.  If anything, he is probably volume-contracted.   GASTROINTESTINAL:  ABDOMEN:   Slightly distended.   LIVER/SPLEEN/KIDNEYS:  No liver, no spleen.   GENITOURINARY:  BLADDER:   No suprapubic or costovertebral angle tenderness.    ASSESSMENT/PLAN:  Hypothyroidism.  I suspect this is really due to difficulty giving him his medication rather than any inadequacy of the prescribed dose.  A lot of effort has already been made to keep the patient compliant with his medications.  It would appear that this is not enough.  I will discuss with the staff what can be done.  A TSH of 24.36 is hardly a life-threatening situation.  However, if this  progressively worsens with time, this could become a clinically relevant issue.     CPT CODE: 65784

## 2013-11-25 ENCOUNTER — Non-Acute Institutional Stay (SKILLED_NURSING_FACILITY): Payer: Medicare Other | Admitting: Adult Health

## 2013-11-25 DIAGNOSIS — F32A Depression, unspecified: Secondary | ICD-10-CM

## 2013-11-25 DIAGNOSIS — F3289 Other specified depressive episodes: Secondary | ICD-10-CM

## 2013-11-25 DIAGNOSIS — F0280 Dementia in other diseases classified elsewhere without behavioral disturbance: Secondary | ICD-10-CM

## 2013-11-25 DIAGNOSIS — G20 Parkinson's disease: Secondary | ICD-10-CM

## 2013-11-25 DIAGNOSIS — F329 Major depressive disorder, single episode, unspecified: Secondary | ICD-10-CM

## 2013-11-25 DIAGNOSIS — E039 Hypothyroidism, unspecified: Secondary | ICD-10-CM

## 2013-11-25 DIAGNOSIS — G309 Alzheimer's disease, unspecified: Secondary | ICD-10-CM

## 2013-11-25 DIAGNOSIS — G8929 Other chronic pain: Secondary | ICD-10-CM

## 2013-12-18 ENCOUNTER — Encounter: Payer: Self-pay | Admitting: Adult Health

## 2013-12-18 DIAGNOSIS — F329 Major depressive disorder, single episode, unspecified: Secondary | ICD-10-CM | POA: Insufficient documentation

## 2013-12-18 DIAGNOSIS — F32A Depression, unspecified: Secondary | ICD-10-CM | POA: Insufficient documentation

## 2013-12-18 DIAGNOSIS — G8929 Other chronic pain: Secondary | ICD-10-CM | POA: Insufficient documentation

## 2013-12-18 MED ORDER — LIOTHYRONINE SODIUM 25 MCG PO TABS: 25.0000 ug | ORAL_TABLET | Freq: Every day | ORAL | Status: DC

## 2013-12-18 NOTE — Progress Notes (Signed)
Patient ID: Phillip Gutierrez, male   DOB: 09-02-1923, 80 y.o.   MRN: [id number]     [facility]  No Known Allergies   Chief Complaint  Patient presents with  . Medical Management of Chronic Issues    HPI:  He is being seen for the mangement of his chronic illnesses. There are no concerns being voiced byt the nursing staff at this time. His tsh continues to climb despite increasing his synthroid dose. He may benefit from beginning low dose cytomel.    Past Medical History  Diagnosis Date  . Thyroid disease   . Dementia     No past surgical history on file.  VITAL SIGNS BP 108/68  Pulse 69  Ht 5\' 9"  (1.753 m)  Wt 146 lb (66.225 kg)  BMI 21.55 kg/m2   Patient's Medications  New Prescriptions   No medications on file  Previous Medications   ACETAMINOPHEN (TYLENOL) 500 MG TABLET    Take 500 mg by mouth 2 (two) times daily.   CARBIDOPA-LEVODOPA (SINEMET IR) 25-250 MG PER TABLET    Take 1 tablet by mouth 3 (three) times daily.   DIVALPROEX (DEPAKOTE SPRINKLE) 125 MG CAPSULE    Take 375 mg by mouth 2 (two) times daily.   ESCITALOPRAM (LEXAPRO) 10 MG TABLET    Take 15 mg by mouth daily.   SENNA (SENOKOT) 8.6 MG TABS TABLET    Take 1 tablet by mouth at bedtime.  Modified Medications   Modified Medication Previous Medication   LEVOTHYROXINE (SYNTHROID, LEVOTHROID) 200 MCG TABLET levothyroxine (SYNTHROID, LEVOTHROID) 200 MCG tablet      Take 250 mcg by mouth daily before breakfast.    Take 1.5 tablets (300 mcg total) by mouth daily before breakfast.  Discontinued Medications   No medications on file    SIGNIFICANT DIAGNOSTIC EXAMS   LABS REVIEWED:   06-30-13: tsh 4.340 07-09-13: tsh 10.440 08-29-13: tsh 0.099 10-14-13: tsh 24.360 11-24-13: tsh 47.720    Review of Systems  Unable to perform ROS  Physical Exam  Constitutional: No distress.  Neck: Neck supple. No JVD present.  Cardiovascular: Normal rate, regular rhythm and intact distal pulses.   Respiratory: Effort  normal and breath sounds normal. No respiratory distress. He has no wheezes.  GI: Soft. Bowel sounds are normal. He exhibits no distension. There is no tenderness.  Musculoskeletal: He exhibits no edema.  Neurological: He is alert.  Skin: Skin is warm and dry. He is not diaphoretic.       ASSESSMENT/ PLAN:  1. Parkinson's disease: on change in status will continue sinemet 25/250 mg three times daily and will monitor   2. Depression will continue lexapro 15 mg daily and will monitor  3. Chronic pain: no indications of pain present: will continue tylenol 500 mg twice daily   4. Hypothyroidism: his tsh continues to climb despite increasing his synthroid. Will continue synthroid 250 mcg daily and will begin cytomel 25 mcg daily in 6 weeks will check tsh free t3 and free t4 and will monitor   5. Alzheimer's disease: is without significant change is presently not on medications will not make changes.

## 2014-01-05 ENCOUNTER — Non-Acute Institutional Stay (SKILLED_NURSING_FACILITY): Payer: Medicare Other | Admitting: Internal Medicine

## 2014-01-05 DIAGNOSIS — K5900 Constipation, unspecified: Secondary | ICD-10-CM

## 2014-01-05 DIAGNOSIS — G309 Alzheimer's disease, unspecified: Secondary | ICD-10-CM

## 2014-01-05 DIAGNOSIS — E039 Hypothyroidism, unspecified: Secondary | ICD-10-CM

## 2014-01-05 DIAGNOSIS — F0280 Dementia in other diseases classified elsewhere without behavioral disturbance: Secondary | ICD-10-CM

## 2014-01-05 DIAGNOSIS — G20 Parkinson's disease: Secondary | ICD-10-CM

## 2014-01-05 NOTE — Progress Notes (Signed)
        PROGRESS NOTE  DATE: 01-05-14  FACILITY: [facility]  LEVEL OF CARE: SNF  Routine Visit  CHIEF COMPLAINT:  Manage hypothyroidism, Parkinson's disease and dementia  HISTORY OF PRESENT ILLNESS:  REASSESSMENT OF ONGOING PROBLEM(S):  HYPOTHYROIDISM: The hypothyroidism remains stable. No complications noted from the medications presently being used.  The staff deny fatigue or constipation.  Last TSH 15.78 in 4/14, in 5/14 TSH 18.21, in 7/14 TSH 87.44, in 7-14 TSH 83.11, in 11- 14 TSH 4.34, then 10.44, in 4-15 TSH 47.72.  DEMENTIA: The dementia remaines stable and continues to function adequately in the current living environment with supervision.  The patient has had little changes in behavior. No complications noted from the medications presently being used. Patient is a poor historian.  PARKINSON'S DISEASE: pt's Parkinson's disease is stable.  Staff Deny progression of sx recently.  Pt is tolerating Parkinson's disease medications without any complications.  PAST MEDICAL HISTORY : Reviewed.  No changes.  CURRENT MEDICATIONS: Reviewed per [facility]  REVIEW OF SYSTEMS: Unobtainable secondary to dementia.  PHYSICAL EXAMINATION  VS: see vital signs section  GENERAL: no acute distress, normal body habitus EYES: Normal sclerae, normal conjunctivae, no discharge NECK: supple, trachea midline, no neck masses, no thyroid tenderness, no thyromegaly LYMPHATICS: No cervical lymphadenopathy, no supraclavicular lymphadenopathy RESPIRATORY: breathing is even & unlabored, BS CTAB CARDIAC: RRR, no murmur,no extra heart sounds, no edema GI: abdomen soft, normal BS, no masses, no tenderness, no hepatomegaly, no splenomegaly PSYCHIATRIC: the patient is alert & disoriented, affect & behavior appropriate  LABS/RADIOLOGY: 4-15 CBC and CMP normal, Depakote level 10 10- 14 CBC normal, total protein 5.9 otherwise CMP normal, Depakote level 17  4/14 BMP normal, CBC normal, liver profile normal,  Depakote 28  ASSESSMENT/PLAN:  hypothyroidism-uncontrolled, Cytomel started. recheck TSH pending. dementia-advanced. Parkinson's disease-continue Sinemet. constipation-well-controlled. depression-continue current antidepressant. Osteoarthritis-continue Tylenol  CPT CODE: 41962  Edgar Frisk. Durwin Reges, [place] [phone]

## 2014-01-07 ENCOUNTER — Non-Acute Institutional Stay (SKILLED_NURSING_FACILITY): Payer: Medicare Other | Admitting: Internal Medicine

## 2014-01-07 DIAGNOSIS — E039 Hypothyroidism, unspecified: Secondary | ICD-10-CM

## 2014-01-12 NOTE — Progress Notes (Signed)
Patient ID: Phillip Gutierrez, male   DOB: 31-Aug-1923, 80 y.o.   MRN: [id number]            PROGRESS NOTE  DATE: 01/07/2014       FACILITY:  [facility]  LEVEL OF CARE: SNF (31)  Acute Visit  CHIEF COMPLAINT:  Manage hypothyroidism.    HISTORY OF PRESENT ILLNESS: I was requested by the staff to assess the patient regarding above problem(s):  HYPOTHYROIDISM: The hypothyroidism is unstable. No complications noted from the medications presently being used.  The staff denies fatigue or constipation.  Last TSH:  On 01/06/2014:  TSH 24.46.  Patient is a poor historian due to dementia.    PAST MEDICAL HISTORY : Reviewed.  No changes/see problem list  CURRENT MEDICATIONS: Reviewed per [facility]/see medication list  REVIEW OF SYSTEMS:  Unobtainable due to dementia.    PHYSICAL EXAMINATION  VS:  T 98       P 67      RR 16      BP 119/65     POX 97%       WT (Lb) 146      GENERAL: no acute distress, normal body habitus NECK: supple, trachea midline, no neck masses, no thyroid tenderness, no thyromegaly RESPIRATORY: breathing is even & unlabored, BS CTAB CARDIAC: RRR, no murmur,no extra heart sounds, no edema GI: abdomen soft, normal BS, no masses, no tenderness, no hepatomegaly, no splenomegaly PSYCHIATRIC: the patient is alert, disoriented, affect & behavior appropriate  ASSESSMENT/PLAN:  Hypothyroidism.  Uncontrolled problem.  Increase Cytomel to 50 mcg q.d.  Check TSH in six weeks.    CPT CODE: 48250       Gayani Y Dasanayaka, [place] [phone]

## 2014-01-30 ENCOUNTER — Non-Acute Institutional Stay (SKILLED_NURSING_FACILITY): Payer: Medicare Other | Admitting: Internal Medicine

## 2014-01-30 DIAGNOSIS — G20 Parkinson's disease: Secondary | ICD-10-CM

## 2014-01-30 DIAGNOSIS — K5900 Constipation, unspecified: Secondary | ICD-10-CM

## 2014-01-30 DIAGNOSIS — F0280 Dementia in other diseases classified elsewhere without behavioral disturbance: Secondary | ICD-10-CM

## 2014-01-30 DIAGNOSIS — G309 Alzheimer's disease, unspecified: Secondary | ICD-10-CM

## 2014-01-30 DIAGNOSIS — E039 Hypothyroidism, unspecified: Secondary | ICD-10-CM

## 2014-01-30 NOTE — Progress Notes (Signed)
        PROGRESS NOTE  DATE: 01-30-14  FACILITY: [facility]  LEVEL OF CARE: SNF  Routine Visit  CHIEF COMPLAINT:  Manage hypothyroidism, Parkinson's disease and dementia  HISTORY OF PRESENT ILLNESS:  REASSESSMENT OF ONGOING PROBLEM(S):  HYPOTHYROIDISM: The hypothyroidism remains stable. No complications noted from the medications presently being used.  The staff deny fatigue or constipation.  Last TSH 15.78 in 4/14, in 5/14 TSH 18.21, in 7/14 TSH 87.44, in 7-14 TSH 83.11, in 11- 14 TSH 4.34, then 10.44, in 4-15 TSH 47.72, in 5-15 TSH 24.46.  DEMENTIA: The dementia remaines stable and continues to function adequately in the current living environment with supervision.  The patient has had little changes in behavior. No complications noted from the medications presently being used. Patient is a poor historian.  PARKINSON'S DISEASE: pt's Parkinson's disease is stable.  Staff Deny progression of sx recently.  Pt is tolerating Parkinson's disease medications without any complications.  PAST MEDICAL HISTORY : Reviewed.  No changes.  CURRENT MEDICATIONS: Reviewed per [facility]  REVIEW OF SYSTEMS: Unobtainable secondary to dementia.  PHYSICAL EXAMINATION  VS: see vital signs section  GENERAL: no acute distress, normal body habitus NECK: supple, trachea midline, no neck masses, no thyroid tenderness, no thyromegaly RESPIRATORY: breathing is even & unlabored, BS CTAB CARDIAC: RRR, no murmur,no extra heart sounds, no edema GI: abdomen soft, normal BS, no masses, no tenderness, no hepatomegaly, no splenomegaly PSYCHIATRIC: the patient is alert & disoriented, affect & behavior appropriate  LABS/RADIOLOGY: 4-15 CBC and CMP normal, Depakote level 10 10- 14 CBC normal, total protein 5.9 otherwise CMP normal, Depakote level 17  4/14 BMP normal, CBC normal, liver profile normal, Depakote 28  ASSESSMENT/PLAN:  hypothyroidism-uncontrolled, Cytomel was increased. recheck TSH  pending. dementia-advanced. Parkinson's disease-continue Sinemet. constipation-well-controlled. depression-continue current antidepressant. Osteoarthritis-continue Tylenol  CPT CODE: 43329  Edgar Frisk. Durwin Reges, [place] [phone]

## 2014-03-02 ENCOUNTER — Non-Acute Institutional Stay (SKILLED_NURSING_FACILITY): Payer: Medicare Other | Admitting: Internal Medicine

## 2014-03-02 DIAGNOSIS — G309 Alzheimer's disease, unspecified: Secondary | ICD-10-CM

## 2014-03-02 DIAGNOSIS — F0280 Dementia in other diseases classified elsewhere without behavioral disturbance: Secondary | ICD-10-CM

## 2014-03-02 DIAGNOSIS — G20 Parkinson's disease: Secondary | ICD-10-CM

## 2014-03-02 DIAGNOSIS — K5900 Constipation, unspecified: Secondary | ICD-10-CM

## 2014-03-02 DIAGNOSIS — E039 Hypothyroidism, unspecified: Secondary | ICD-10-CM

## 2014-03-02 NOTE — Progress Notes (Signed)
        PROGRESS NOTE  DATE: 03-02-14  FACILITY: [facility]  LEVEL OF CARE: SNF  Routine Visit  CHIEF COMPLAINT:  Manage hypothyroidism, Parkinson's disease and dementia  HISTORY OF PRESENT ILLNESS:  REASSESSMENT OF ONGOING PROBLEM(S):  HYPOTHYROIDISM: The hypothyroidism remains stable. No complications noted from the medications presently being used.  The staff deny fatigue or constipation.  Last TSH 15.78 in 4/14, in 5/14 TSH 18.21, in 7/14 TSH 87.44, in 7-14 TSH 83.11, in 11- 14 TSH 4.34, then 10.44, in 4-15 TSH 47.72, in 5-15 TSH 24.46.  DEMENTIA: The dementia remaines stable and continues to function adequately in the current living environment with supervision.  The patient has had little changes in behavior. No complications noted from the medications presently being used. Patient is a poor historian.  PARKINSON'S DISEASE: pt's Parkinson's disease is stable.  Staff Deny progression of sx recently.  Pt is tolerating Parkinson's disease medications without any complications.  PAST MEDICAL HISTORY : Reviewed.  No changes.  CURRENT MEDICATIONS: Reviewed per [facility]  REVIEW OF SYSTEMS: Unobtainable secondary to dementia.  PHYSICAL EXAMINATION  VS: see vital signs section  GENERAL: no acute distress, normal body habitus NECK: supple, trachea midline, no neck masses, no thyroid tenderness, no thyromegaly RESPIRATORY: breathing is even & unlabored, BS CTAB CARDIAC: RRR, no murmur,no extra heart sounds, no edema GI: abdomen soft, normal BS, no masses, no tenderness, no hepatomegaly, no splenomegaly PSYCHIATRIC: the patient is alert & disoriented, affect & behavior appropriate  LABS/RADIOLOGY: 4-15 CBC and CMP normal, Depakote level 10 10- 14 CBC normal, total protein 5.9 otherwise CMP normal, Depakote level 17  4/14 BMP normal, CBC normal, liver profile normal, Depakote 28  ASSESSMENT/PLAN:  hypothyroidism-uncontrolled, Cytomel was increased. recheck  TSH. dementia-advanced. Parkinson's disease-continue Sinemet. constipation-well-controlled. depression-continue current antidepressant. Osteoarthritis-continue Tylenol  CPT CODE: 25003  Edgar Frisk. Durwin Reges, [place] [phone]

## 2014-03-08 ENCOUNTER — Encounter (HOSPITAL_COMMUNITY): Payer: Self-pay | Admitting: Emergency Medicine

## 2014-03-08 ENCOUNTER — Emergency Department (HOSPITAL_COMMUNITY): Payer: Medicare Other

## 2014-03-08 ENCOUNTER — Inpatient Hospital Stay (HOSPITAL_COMMUNITY)
Admission: EM | Admit: 2014-03-08 | Discharge: 2014-03-10 | DRG: 871 | Disposition: A | Discharge status: Skilled Nursing Facility | Payer: Medicare Other | Attending: Internal Medicine | Admitting: Internal Medicine

## 2014-03-08 DIAGNOSIS — Z66 Do not resuscitate: Secondary | ICD-10-CM | POA: Diagnosis present

## 2014-03-08 DIAGNOSIS — R0902 Hypoxemia: Secondary | ICD-10-CM | POA: Diagnosis present

## 2014-03-08 DIAGNOSIS — Z87891 Personal history of nicotine dependence: Secondary | ICD-10-CM | POA: Diagnosis not present

## 2014-03-08 DIAGNOSIS — E039 Hypothyroidism, unspecified: Secondary | ICD-10-CM

## 2014-03-08 DIAGNOSIS — A419 Sepsis, unspecified organism: Secondary | ICD-10-CM | POA: Diagnosis not present

## 2014-03-08 DIAGNOSIS — G3183 Dementia with Lewy bodies: Secondary | ICD-10-CM

## 2014-03-08 DIAGNOSIS — E079 Disorder of thyroid, unspecified: Secondary | ICD-10-CM | POA: Diagnosis present

## 2014-03-08 DIAGNOSIS — I519 Heart disease, unspecified: Secondary | ICD-10-CM | POA: Diagnosis not present

## 2014-03-08 DIAGNOSIS — F0280 Dementia in other diseases classified elsewhere without behavioral disturbance: Secondary | ICD-10-CM | POA: Diagnosis present

## 2014-03-08 DIAGNOSIS — R0602 Shortness of breath: Secondary | ICD-10-CM | POA: Diagnosis not present

## 2014-03-08 DIAGNOSIS — Z515 Encounter for palliative care: Secondary | ICD-10-CM

## 2014-03-08 DIAGNOSIS — G309 Alzheimer's disease, unspecified: Secondary | ICD-10-CM

## 2014-03-08 DIAGNOSIS — E870 Hyperosmolality and hypernatremia: Secondary | ICD-10-CM | POA: Diagnosis present

## 2014-03-08 DIAGNOSIS — J690 Pneumonitis due to inhalation of food and vomit: Secondary | ICD-10-CM | POA: Diagnosis present

## 2014-03-08 DIAGNOSIS — K5900 Constipation, unspecified: Secondary | ICD-10-CM

## 2014-03-08 DIAGNOSIS — R4182 Altered mental status, unspecified: Secondary | ICD-10-CM

## 2014-03-08 DIAGNOSIS — R509 Fever, unspecified: Secondary | ICD-10-CM

## 2014-03-08 HISTORY — DX: Parkinson's disease without dyskinesia, without mention of fluctuations: G20.A1

## 2014-03-08 HISTORY — DX: Parkinson's disease: G20

## 2014-03-08 LAB — I-STAT ARTERIAL BLOOD GAS, ED
Acid-Base Excess: 7 mmol/L — ABNORMAL HIGH (ref 0.0–2.0)
Bicarbonate: 32 mEq/L — ABNORMAL HIGH (ref 20.0–24.0)
O2 Saturation: 97 %
PH ART: 7.472 — AB (ref 7.350–7.450)
PO2 ART: 82 mmHg (ref 80.0–100.0)
Patient temperature: 98.6
TCO2: 33 mmol/L (ref 0–100)
pCO2 arterial: 43.8 mmHg (ref 35.0–45.0)

## 2014-03-08 LAB — URINALYSIS, ROUTINE W REFLEX MICROSCOPIC
BILIRUBIN URINE: NEGATIVE
Glucose, UA: NEGATIVE mg/dL
Hgb urine dipstick: NEGATIVE
KETONES UR: NEGATIVE mg/dL
Leukocytes, UA: NEGATIVE
Nitrite: NEGATIVE
PROTEIN: NEGATIVE mg/dL
SPECIFIC GRAVITY, URINE: 1.015 (ref 1.005–1.030)
Urobilinogen, UA: 0.2 mg/dL (ref 0.0–1.0)
pH: 5 (ref 5.0–8.0)

## 2014-03-08 LAB — COMPREHENSIVE METABOLIC PANEL
ALK PHOS: 58 U/L (ref 39–117)
ALT: 11 U/L (ref 0–53)
AST: 26 U/L (ref 0–37)
Albumin: 3.3 g/dL — ABNORMAL LOW (ref 3.5–5.2)
Anion gap: 16 — ABNORMAL HIGH (ref 5–15)
BUN: 30 mg/dL — AB (ref 6–23)
CALCIUM: 9.4 mg/dL (ref 8.4–10.5)
CHLORIDE: 106 meq/L (ref 96–112)
CO2: 27 meq/L (ref 19–32)
CREATININE: 0.86 mg/dL (ref 0.50–1.35)
GFR calc Af Amer: 87 mL/min — ABNORMAL LOW (ref >90)
GFR calc non Af Amer: 75 mL/min — ABNORMAL LOW (ref >90)
GLUCOSE: 155 mg/dL — AB (ref 70–99)
POTASSIUM: 4.2 meq/L (ref 3.7–5.3)
SODIUM: 149 meq/L — AB (ref 137–147)
Total Bilirubin: 0.4 mg/dL (ref 0.3–1.2)
Total Protein: 7.2 g/dL (ref 6.0–8.3)

## 2014-03-08 LAB — CBC WITH DIFFERENTIAL/PLATELET
Basophils Absolute: 0 10*3/uL (ref 0.0–0.1)
Basophils Relative: 0 % (ref 0–1)
EOS PCT: 0 % (ref 0–5)
Eosinophils Absolute: 0 10*3/uL (ref 0.0–0.7)
HCT: 44.2 % (ref 39.0–52.0)
HEMOGLOBIN: 14.4 g/dL (ref 13.0–17.0)
LYMPHS ABS: 1.3 10*3/uL (ref 0.7–4.0)
Lymphocytes Relative: 14 % (ref 12–46)
MCH: 30.9 pg (ref 26.0–34.0)
MCHC: 32.6 g/dL (ref 30.0–36.0)
MCV: 94.8 fL (ref 78.0–100.0)
MONO ABS: 1 10*3/uL (ref 0.1–1.0)
MONOS PCT: 12 % (ref 3–12)
NEUTROS ABS: 6.6 10*3/uL (ref 1.7–7.7)
NEUTROS PCT: 74 % (ref 43–77)
Platelets: 243 10*3/uL (ref 150–400)
RBC: 4.66 MIL/uL (ref 4.22–5.81)
RDW: 12.8 % (ref 11.5–15.5)
WBC: 8.9 10*3/uL (ref 4.0–10.5)

## 2014-03-08 LAB — I-STAT CG4 LACTIC ACID, ED: LACTIC ACID, VENOUS: 1.11 mmol/L (ref 0.5–2.2)

## 2014-03-08 MED ORDER — ENOXAPARIN SODIUM 40 MG/0.4ML ~~LOC~~ SOLN
40.0000 mg | SUBCUTANEOUS | Status: DC
  Administered 2014-03-09 – 2014-03-10 (×2): 40 mg via SUBCUTANEOUS
  Filled 2014-03-08 (×2): qty 0.4

## 2014-03-08 MED ORDER — SODIUM CHLORIDE 0.9 % IJ SOLN: 3.0000 mL | INTRAMUSCULAR | Status: DC | PRN

## 2014-03-08 MED ORDER — SODIUM CHLORIDE 0.9 % IV BOLUS (SEPSIS)
30.0000 mL/kg | Freq: Once | INTRAVENOUS | Status: AC
  Administered 2014-03-08: 1000 mL via INTRAVENOUS

## 2014-03-08 MED ORDER — SODIUM CHLORIDE 0.9 % IJ SOLN
3.0000 mL | Freq: Two times a day (BID) | INTRAMUSCULAR | Status: DC
  Administered 2014-03-09 – 2014-03-10 (×2): 3 mL via INTRAVENOUS

## 2014-03-08 MED ORDER — DEXTROSE 5 % IV SOLN: 1.0000 g | INTRAVENOUS | Status: DC

## 2014-03-08 MED ORDER — ONDANSETRON HCL 4 MG/2ML IJ SOLN: 4.0000 mg | Freq: Four times a day (QID) | INTRAMUSCULAR | Status: DC | PRN

## 2014-03-08 MED ORDER — SODIUM CHLORIDE 0.9 % IV SOLN: 250.0000 mL | INTRAVENOUS | Status: DC | PRN

## 2014-03-08 MED ORDER — ESCITALOPRAM OXALATE 5 MG PO TABS
15.0000 mg | ORAL_TABLET | Freq: Every day | ORAL | Status: DC
  Filled 2014-03-08 (×2): qty 3

## 2014-03-08 MED ORDER — SODIUM CHLORIDE 0.9 % IJ SOLN
3.0000 mL | Freq: Two times a day (BID) | INTRAMUSCULAR | Status: DC
  Administered 2014-03-09: 3 mL via INTRAVENOUS

## 2014-03-08 MED ORDER — DEXTROSE 5 % IV SOLN
2.0000 g | Freq: Once | INTRAVENOUS | Status: AC
  Administered 2014-03-08: 2 g via INTRAVENOUS
  Filled 2014-03-08: qty 2

## 2014-03-08 MED ORDER — LEVOTHYROXINE SODIUM 125 MCG PO TABS: 250.0000 ug | ORAL_TABLET | Freq: Every day | ORAL | Status: DC

## 2014-03-08 MED ORDER — LIOTHYRONINE SODIUM 25 MCG PO TABS
50.0000 ug | ORAL_TABLET | Freq: Every day | ORAL | Status: DC
  Filled 2014-03-08 (×2): qty 2

## 2014-03-08 MED ORDER — LEVOTHYROXINE SODIUM 100 MCG IV SOLR
125.0000 ug | Freq: Every day | INTRAVENOUS | Status: DC
  Administered 2014-03-09 – 2014-03-10 (×2): 125 ug via INTRAVENOUS
  Filled 2014-03-08 (×3): qty 10

## 2014-03-08 MED ORDER — PIPERACILLIN-TAZOBACTAM 3.375 G IVPB
3.3750 g | Freq: Three times a day (TID) | INTRAVENOUS | Status: DC
  Administered 2014-03-09 – 2014-03-10 (×5): 3.375 g via INTRAVENOUS
  Filled 2014-03-08 (×7): qty 50

## 2014-03-08 MED ORDER — SENNA 8.6 MG PO TABS
1.0000 | ORAL_TABLET | Freq: Every day | ORAL | Status: DC
  Filled 2014-03-08 (×3): qty 1

## 2014-03-08 MED ORDER — ENOXAPARIN SODIUM 40 MG/0.4ML ~~LOC~~ SOLN: 40.0000 mg | SUBCUTANEOUS | Status: DC

## 2014-03-08 MED ORDER — SODIUM CHLORIDE 0.9 % IV SOLN: INTRAVENOUS | Status: AC

## 2014-03-08 MED ORDER — ACETAMINOPHEN 325 MG PO TABS: 650.0000 mg | ORAL_TABLET | ORAL | Status: DC | PRN

## 2014-03-08 MED ORDER — VANCOMYCIN HCL IN DEXTROSE 1-5 GM/200ML-% IV SOLN
1000.0000 mg | Freq: Once | INTRAVENOUS | Status: AC
  Administered 2014-03-08: 1000 mg via INTRAVENOUS
  Filled 2014-03-08: qty 200

## 2014-03-08 MED ORDER — DIVALPROEX SODIUM 125 MG PO CPSP
375.0000 mg | ORAL_CAPSULE | Freq: Two times a day (BID) | ORAL | Status: DC
  Filled 2014-03-08 (×3): qty 3

## 2014-03-08 MED ORDER — VANCOMYCIN HCL 500 MG IV SOLR
500.0000 mg | Freq: Two times a day (BID) | INTRAVENOUS | Status: DC
  Administered 2014-03-09 – 2014-03-10 (×2): 500 mg via INTRAVENOUS
  Filled 2014-03-08 (×6): qty 500

## 2014-03-08 MED ORDER — ESCITALOPRAM OXALATE 10 MG PO TABS: 10.0000 mg | ORAL_TABLET | Freq: Every day | ORAL | Status: DC

## 2014-03-08 MED ORDER — SODIUM CHLORIDE 0.9 % IV SOLN
1000.0000 mL | INTRAVENOUS | Status: DC
  Administered 2014-03-08 (×2): 1000 mL via INTRAVENOUS

## 2014-03-08 MED ORDER — CARBIDOPA-LEVODOPA 25-250 MG PO TABS
1.0000 | ORAL_TABLET | Freq: Three times a day (TID) | ORAL | Status: DC
  Filled 2014-03-08 (×7): qty 1

## 2014-03-08 NOTE — ED Notes (Signed)
Pt. Nasotracheally sx.'d for small amount thick white sputum, no complications, RN @ bedside.

## 2014-03-08 NOTE — ED Notes (Signed)
Pt has hx of parkinsons and sometimes has difficulty swallowing. Staff at nursing facility reports pt is not at baseline, is less responsive. Chest xray completed at facility, showing "patchy bilateral infiltrates." Rocephin 1g IM given at 1030am; 650mg  Tylenol given at 5pm for fever of 101.3; 20mg  Lasix IM given at 2pm. Patient responsive to pain

## 2014-03-08 NOTE — H&P (Signed)
PCP:  [facility]   Chief Complaint:  hypoxia  HPI: Phillip Gutierrez is a 80 y.o. male   has a past medical history of Thyroid disease; Dementia; and Parkinson disease.   On 03/08/2014 he was eating dinner when was noted to have "gurgling breath sounds". Nursing stuff though he may have aspirated apiece of candy he was oral suctioned and his oxygen saturation improved from 88% to 94%. He was given rocephin IM and had a CXR done that showed bilateral edema? at the bases. Patient was given 20 mg of PO lasix and EMS was called for transfered to [facility]. He was noted to be febrile up to 101.5 with RR of 33.  Repeat CXR showed no infiltrate.  Patient at baseline is confused but minimally interactive. Currently not following commands. Patietn was placed on NR due to mouth breathing.  Hospitalist was called for admission for  Presumed aspiration PNA resulting in sepsis  Review of Systems:    Pertinent positives include:   Constitutional:  No weight loss, night sweats, Fevers, chills, fatigue, weight loss  HEENT:  No headaches, Difficulty swallowing,Tooth/dental problems,Sore throat,  No sneezing, itching, ear ache, nasal congestion, post nasal drip,  Cardio-vascular:  No chest pain, Orthopnea, PND, anasarca, dizziness, palpitations.no Bilateral lower extremity swelling  GI:  No heartburn, indigestion, abdominal pain, nausea, vomiting, diarrhea, change in bowel habits, loss of appetite, melena, blood in stool, hematemesis Resp:  no shortness of breath at rest. No dyspnea on exertion, No excess mucus, no productive cough, No non-productive cough, No coughing up of blood.No change in color of mucus.No wheezing. Skin:  no rash or lesions. No jaundice GU:  no dysuria, change in color of urine, no urgency or frequency. No straining to urinate.  No flank pain.  Musculoskeletal:  No joint pain or no joint swelling. No decreased range of motion. No back pain.  Psych:  No change in mood or affect.  No depression or anxiety. No memory loss.  Neuro: no localizing neurological complaints, no tingling, no weakness, no double vision, no gait abnormality, no slurred speech, no confusion  Otherwise ROS are negative except for above, 10 systems were reviewed  Past Medical History: Past Medical History  Diagnosis Date  . Thyroid disease   . Dementia    History reviewed. No pertinent past surgical history.   Medications: Prior to Admission medications   Medication Sig Start Date End Date Taking? Authorizing Provider  acetaminophen (TYLENOL) 500 MG tablet Take 500 mg by mouth 2 (two) times daily.   Yes Historical Provider, MD  acetaminophen (TYLENOL) 650 MG suppository Place 650 mg rectally once.   Yes Historical Provider, MD  carbidopa-levodopa (SINEMET IR) 25-250 MG per tablet Take 1 tablet by mouth 3 (three) times daily before meals.    Yes Historical Provider, MD  cefTRIAXone (ROCEPHIN) 1 G injection Inject into the muscle once.   Yes Historical Provider, MD  divalproex (DEPAKOTE SPRINKLE) 125 MG capsule Take 375 mg by mouth 2 (two) times daily.   Yes Historical Provider, MD  escitalopram (LEXAPRO) 10 MG tablet Take 10 mg by mouth daily. Take with 5mg  dose for a total of 15mg  daily   Yes Historical Provider, MD  escitalopram (LEXAPRO) 5 MG tablet Take 5 mg by mouth daily. Take with 10mg  tablet for a total dose of 15 mg daily   Yes Historical Provider, MD  furosemide (LASIX) 10 MG/ML solution Take 20 mg by mouth once.   Yes Historical Provider, MD  levothyroxine (  SYNTHROID, LEVOTHROID) 125 MCG tablet Take 250 mcg by mouth daily.   Yes Historical Provider, MD  liothyronine (CYTOMEL) 50 MCG tablet Take 50 mcg by mouth daily.   Yes Historical Provider, MD  senna (SENOKOT) 8.6 MG TABS tablet Take 1 tablet by mouth at bedtime.   Yes Historical Provider, MD    Allergies:   Allergies  Allergen Reactions  . Zoloft [Sertraline Hcl]     NH MAR    Social History:  Ambulatory   walker     From facility [facility]   reports that he has quit smoking. He does not have any smokeless tobacco history on file. He reports that he does not drink alcohol or use illicit drugs.    Family History: family history includes Cancer in his brother and sister; Colon cancer in his father; Throat cancer in his mother.    Physical Exam: Patient Vitals for the past 24 hrs:  BP Temp Temp src Pulse Resp SpO2  03/08/14 2206 - 98.2 F (36.8 C) Other - - -  03/08/14 2200 102/88 mmHg - - 77 20 100 %  03/08/14 2145 132/84 mmHg - - 78 20 99 %  03/08/14 2130 123/56 mmHg - - 72 23 100 %  03/08/14 2115 125/61 mmHg - - 75 15 100 %  03/08/14 2100 129/56 mmHg - - 72 21 100 %  03/08/14 2045 121/81 mmHg - - 75 23 100 %  03/08/14 2030 128/57 mmHg - - 75 20 100 %  03/08/14 1945 123/104 mmHg 99.1 F (37.3 C) - 88 29 100 %  03/08/14 1930 154/81 mmHg - - 87 22 100 %  03/08/14 1900 134/75 mmHg - - 86 23 96 %  03/08/14 1830 136/68 mmHg - - 81 33 96 %  03/08/14 1829 - 101.3 F (38.5 C) Rectal - - -  03/08/14 1826 135/66 mmHg - - 79 32 99 %  03/08/14 1818 - - - - - 90 %    1. General:  in No Acute distress 2. Psychological: Alert but not Oriented 3. Head/ENT:   Dry Mucous Membranes                          Head Non traumatic, neck supple                          Poor Dentition 4. SKIN: normal  Skin turgor,  Skin clean Dry and intact no rash 5. Heart: Regular rate and rhythm no Murmur, Rub or gallop 6. Lungs:no wheezes occasional crackles   7. Abdomen: Soft, non-tender, Non distended 8. Lower extremities: no clubbing, cyanosis, or edema 9. Neurologically not cooperative with exam, tremor noted 10. MSK: Normal range of motion  body mass index is unknown because there is no weight on file.   Labs on Admission:   Recent Labs  03/08/14 1830  NA 149*  K 4.2  CL 106  CO2 27  GLUCOSE 155*  BUN 30*  CREATININE 0.86  CALCIUM 9.4    Recent Labs  03/08/14 1830  AST 26  ALT 11  ALKPHOS 58   BILITOT 0.4  PROT 7.2  ALBUMIN 3.3*   No results found for this basename: LIPASE, AMYLASE,  in the last 72 hours  Recent Labs  03/08/14 1830  WBC 8.9  NEUTROABS 6.6  HGB 14.4  HCT 44.2  MCV 94.8  PLT 243   No results found for this  basename: CKTOTAL, CKMB, CKMBINDEX, TROPONINI,  in the last 72 hours No results found for this basename: TSH, T4TOTAL, FREET3, T3FREE, THYROIDAB,  in the last 72 hours No results found for this basename: VITAMINB12, FOLATE, FERRITIN, TIBC, IRON, RETICCTPCT,  in the last 72 hours No results found for this basename: HGBA1C    The CrCl is unknown because both a height and weight (above a minimum accepted value) are required for this calculation. ABG    Component Value Date/Time   PHART 7.472* 03/08/2014 1838   HCO3 32.0* 03/08/2014 1838   TCO2 33 03/08/2014 1838   O2SAT 97.0 03/08/2014 1838     No results found for this basename: DDIMER        UA no evidence of urinary tract infection  BNP (last 3 results) No results found for this basename: PROBNP,  in the last 8760 hours  There were no vitals filed for this visit.   Cultures: No results found for this basename: sdes, specrequest, cult, reptstatus   Radiological Exams on Admission: Ct Head Wo Contrast  03/08/2014   CLINICAL DATA:  Mental status change.  EXAM: CT HEAD WITHOUT CONTRAST  TECHNIQUE: Contiguous axial images were obtained from the base of the skull through the vertex without intravenous contrast.  COMPARISON:  10/10/2006.  FINDINGS: Progressive age related cerebral atrophy, ventriculomegaly and periventricular white matter disease. No extra-axial fluid collections. No CT findings for acute hemispheric infarction an or intracranial hemorrhage. No mass lesions. The brainstem and cerebellum are grossly normal.  No acute bony findings. Minimal scattered sinus disease. Scattered mastoid effusions.  IMPRESSION: Age related cerebral atrophy, ventriculomegaly and periventricular white  matter disease.  No acute intracranial findings.   Electronically Signed   By: Kalman Jewels M.D.   On: 03/08/2014 20:36   Dg Chest Port 1 View  03/08/2014   CLINICAL DATA:  Shortness of breath.  EXAM: PORTABLE CHEST - 1 VIEW  COMPARISON:  10/10/2006  FINDINGS: Heart size and pulmonary vascularity are normal. There is slight elevation of the left hemidiaphragm with minimal atelectasis at the left lung base. Right lung is clear. No effusions. No acute osseous abnormality.  IMPRESSION: Slight atelectasis at the left lung base.   Electronically Signed   By: Rozetta Nunnery M.D.   On: 03/08/2014 19:16    Chart has been reviewed  Assessment/Plan 80 year old gentleman history of Parkinson disease and dementia presents with hypoxia possibly secondary to aspiration versus pulmonary edema now with sepsis likely due to aspiration pneumonia  Present on Admission:  . Alzheimer's disease chronic will continue to monitor watch for sundowning  . Aspiration pneumonia will have speech pathology evaluate for aspiration , please with vancomycin and Zosyn, repeat chest x-ray  . Hypoxia likely secondary to aspiration continue oxygen  . Sepsis mild no evidence of endorgan damage. Will treat with antibiotics no evidence of hypertension  . Hypernatremia mild will give gentle IV fluids Possible edema more likely was secondary to aspiration. No evidence of peripheral edema or fluid overload at this point we'll order echogram Altered mental status in the setting of recent hypoxia and sepsis likely metabolic. CT of the head unremarkable. Will see if it improves with treatment Prophylaxis:  Lovenox, Protonix  CODE STATUS:   DNR/DNI spoke with family on the phone who confirms CODE STATUS  Other plan as per orders.  I have spent a total of 65 min on this admission extra time was taken to discuss care of her family  Anabelen Kaminsky 03/08/2014, 10:20 PM  [facility]  Pager [phone]   If 7AM-7PM, please  contact the day team taking care of the patient  [email or web address]  Password [email or web address]

## 2014-03-08 NOTE — Progress Notes (Signed)
  Pt admitted to the unit. Pt is stable, alert and oriented per baseline. Oriented to room, staff, and call bell. Educated to call for any assistance. Bed in lowest position, call bell within reach- will continue to monitor. 

## 2014-03-08 NOTE — ED Notes (Signed)
Oxygen removed  Sats 94-99%

## 2014-03-08 NOTE — Progress Notes (Signed)
ANTIBIOTIC CONSULT NOTE - INITIAL  Pharmacy Consult for vancomycin and cefepime Indication: HCAP  No Known Allergies  Patient Measurements: weight 66 kg, height 69 kg    Vital Signs: BP: 135/66 mmHg (07/26 1826) Pulse Rate: 79 (07/26 1826) Intake/Output from previous day:   Intake/Output from this shift:    Labs: No results found for this basename: WBC, HGB, PLT, LABCREA, CREATININE,  in the last 72 hours The CrCl is unknown because both a height and weight (above a minimum accepted value) are required for this calculation. No results found for this basename: VANCOTROUGH, VANCOPEAK, VANCORANDOM, GENTTROUGH, GENTPEAK, GENTRANDOM, TOBRATROUGH, TOBRAPEAK, TOBRARND, AMIKACINPEAK, AMIKACINTROU, AMIKACIN,  in the last 72 hours   Microbiology: No results found for this or any previous visit (from the past 720 hour(s)).  Medical History: Past Medical History  Diagnosis Date  . Thyroid disease   . Dementia    Assessment: Patient's an 80 y.o M presented to the ED from [facility] facility for SOB.  She's now febrile.  To start broad antibiotics for suspected HCAP and sepsis. Scr 0.86 (crcl~47).  Patient received vabcomycin 1gm and cefepime 2gm at ~1900.  Goal of Therapy:  Vancomycin trough level 15-20 mcg/ml  Plan:  1) cefepime 1gm IV q24h 2) vancomycin 500mg  IV q12h  Ambra Haverstick P 03/08/2014,6:28 PM

## 2014-03-08 NOTE — ED Notes (Signed)
Pt to ED via [facility] from [facility] c/o shortness of breath, worse today. Hx of alzheimer's disease, baseline per nursing facility, pt can talk to staff. Last night patient had difficulty swallowing requiring suctioning. Throughout the night pt had continuous suctioning for gurgling in mouth.  Pt less responsive today, warm to the touch, sats 90% on NRB.

## 2014-03-08 NOTE — ED Provider Notes (Signed)
CSN: [id number]     Arrival date & time 03/08/14  1813 History   First MD Initiated Contact with Patient 03/08/14 1815     Chief Complaint  Patient presents with  . Shortness of Breath     (Consider location/radiation/quality/duration/timing/severity/associated sxs/prior Treatment) HPI Comments: 68M with hx of Parkinson's - aspirated during dinner last night. Improved with suction. Nurse today noticed poor breathing, per EMS had about 300cc of fluid in a suction canister. He had a CXR last night showing concern for aspiration, given tylenol and rocephin.  Patient is a 80 y.o. male presenting with shortness of breath. The history is provided by the EMS personnel.  Shortness of Breath Severity:  Severe Onset quality:  Gradual Timing:  Constant Progression:  Worsening Chronicity:  New Context: not URI and not weather changes   Context comment:  Aspirated last night Relieved by:  Nothing Worsened by:  Nothing tried   Past Medical History  Diagnosis Date  . Thyroid disease   . Dementia    No past surgical history on file. No family history on file. History  Substance Use Topics  . Smoking status: Former Research scientist (life sciences)  . Smokeless tobacco: Not on file  . Alcohol Use: Not on file    Review of Systems  Unable to perform ROS: Acuity of condition  Respiratory: Positive for shortness of breath.       Allergies  Review of patient's allergies indicates no known allergies.  Home Medications   Prior to Admission medications   Medication Sig Start Date End Date Taking? Authorizing Provider  acetaminophen (TYLENOL) 500 MG tablet Take 500 mg by mouth 2 (two) times daily.    Historical Provider, MD  carbidopa-levodopa (SINEMET IR) 25-250 MG per tablet Take 1 tablet by mouth 3 (three) times daily.    Historical Provider, MD  divalproex (DEPAKOTE SPRINKLE) 125 MG capsule Take 375 mg by mouth 2 (two) times daily.    Historical Provider, MD  escitalopram (LEXAPRO) 10 MG tablet Take 15 mg  by mouth daily.    Historical Provider, MD  levothyroxine (SYNTHROID, LEVOTHROID) 200 MCG tablet Take 250 mcg by mouth daily before breakfast. 05/14/13   Benjiman Core, NP  liothyronine (CYTOMEL) 25 MCG tablet Take 1 tablet (25 mcg total) by mouth daily. 12/18/13   Gerlene Fee, NP  senna (SENOKOT) 8.6 MG TABS tablet Take 1 tablet by mouth at bedtime.    Historical Provider, MD   SpO2 90% Physical Exam  Nursing note and vitals reviewed. Constitutional: He appears well-developed and well-nourished. He appears distressed.  HENT:  Head: Normocephalic and atraumatic.  Mouth/Throat: Oropharynx is clear and moist. No oropharyngeal exudate.  Eyes: EOM are normal. Pupils are equal, round, and reactive to light.  Neck: Normal range of motion. Neck supple.  Cardiovascular: Normal rate and regular rhythm.  Exam reveals no friction rub.   No murmur heard. Pulmonary/Chest: Breath sounds normal. He is in respiratory distress. He has no wheezes. He has no rales.  Abdominal: Soft. He exhibits no distension. There is no tenderness. There is no rebound.  Musculoskeletal: Normal range of motion. He exhibits no edema.  Neurological: GCS eye subscore is 1. GCS verbal subscore is 1. GCS motor subscore is 5.  Unable to cooperate with Neuro exam.  Skin: No rash noted. He is not diaphoretic.    ED Course  Procedures (including critical care time) Labs Review Labs Reviewed  CULTURE, BLOOD (ROUTINE X 2)  CULTURE, BLOOD (ROUTINE X 2)  URINE CULTURE  CBC WITH DIFFERENTIAL  COMPREHENSIVE METABOLIC PANEL  URINALYSIS, ROUTINE W REFLEX MICROSCOPIC  I-STAT CG4 LACTIC ACID, ED  I-STAT ARTERIAL BLOOD GAS, ED    Imaging Review Ct Head Wo Contrast  03/08/2014   CLINICAL DATA:  Mental status change.  EXAM: CT HEAD WITHOUT CONTRAST  TECHNIQUE: Contiguous axial images were obtained from the base of the skull through the vertex without intravenous contrast.  COMPARISON:  10/10/2006.  FINDINGS: Progressive age  related cerebral atrophy, ventriculomegaly and periventricular white matter disease. No extra-axial fluid collections. No CT findings for acute hemispheric infarction an or intracranial hemorrhage. No mass lesions. The brainstem and cerebellum are grossly normal.  No acute bony findings. Minimal scattered sinus disease. Scattered mastoid effusions.  IMPRESSION: Age related cerebral atrophy, ventriculomegaly and periventricular white matter disease.  No acute intracranial findings.   Electronically Signed   By: Kalman Jewels M.D.   On: 03/08/2014 20:36   Dg Chest Port 1 View  03/08/2014   CLINICAL DATA:  Shortness of breath.  EXAM: PORTABLE CHEST - 1 VIEW  COMPARISON:  10/10/2006  FINDINGS: Heart size and pulmonary vascularity are normal. There is slight elevation of the left hemidiaphragm with minimal atelectasis at the left lung base. Right lung is clear. No effusions. No acute osseous abnormality.  IMPRESSION: Slight atelectasis at the left lung base.   Electronically Signed   By: Rozetta Nunnery M.D.   On: 03/08/2014 19:16     EKG Interpretation None      MDM   Final diagnoses:  Altered mental status, unspecified altered mental status type  Fever, unspecified fever cause    80 year old male with history of dementia presents with altered mental status and concern for aspiration. Did aspirate last night, per EMS personnel nursing home said he had improved with suction. Nurse in nursing home notes he was not breathing well, altered. He had had Rocephin and Tylenol for fever and presumed aspiration on chest x-ray. Patient here with altered mental status. He cannot walk with exam. He is having labored breathing. He is a DNR. Will obtain labs, get cultures, give antibiotics. CXR ok, labs with hypernatremia. No leukocytosis. Normal urine. CT head ok.  Admitted by Dr. Roel Cluck.  Osvaldo Shipper, MD 03/09/14 [phone]

## 2014-03-08 NOTE — ED Notes (Addendum)
Phillip Gutierrez (son)([phone]  Call first if needed   Gannon Heinzman cell [phone]  Work [phone]  Ext [phone]

## 2014-03-08 NOTE — Progress Notes (Signed)
Attempted to call. RN will call back

## 2014-03-08 NOTE — ED Notes (Signed)
Cleaned patients mouth.  Removed oxygen

## 2014-03-09 ENCOUNTER — Inpatient Hospital Stay (HOSPITAL_COMMUNITY): Payer: Medicare Other

## 2014-03-09 DIAGNOSIS — J690 Pneumonitis due to inhalation of food and vomit: Secondary | ICD-10-CM

## 2014-03-09 DIAGNOSIS — E870 Hyperosmolality and hypernatremia: Secondary | ICD-10-CM

## 2014-03-09 DIAGNOSIS — F0280 Dementia in other diseases classified elsewhere without behavioral disturbance: Secondary | ICD-10-CM

## 2014-03-09 DIAGNOSIS — G309 Alzheimer's disease, unspecified: Secondary | ICD-10-CM

## 2014-03-09 DIAGNOSIS — I519 Heart disease, unspecified: Secondary | ICD-10-CM

## 2014-03-09 DIAGNOSIS — R4182 Altered mental status, unspecified: Secondary | ICD-10-CM

## 2014-03-09 LAB — COMPREHENSIVE METABOLIC PANEL
ALT: 10 U/L (ref 0–53)
AST: 24 U/L (ref 0–37)
Albumin: 2.9 g/dL — ABNORMAL LOW (ref 3.5–5.2)
Alkaline Phosphatase: 51 U/L (ref 39–117)
Anion gap: 15 (ref 5–15)
BUN: 26 mg/dL — ABNORMAL HIGH (ref 6–23)
CALCIUM: 8.7 mg/dL (ref 8.4–10.5)
CO2: 28 meq/L (ref 19–32)
Chloride: 110 mEq/L (ref 96–112)
Creatinine, Ser: 0.72 mg/dL (ref 0.50–1.35)
GFR, EST NON AFRICAN AMERICAN: 81 mL/min — AB (ref >90)
GLUCOSE: 147 mg/dL — AB (ref 70–99)
Potassium: 4.2 mEq/L (ref 3.7–5.3)
Sodium: 153 mEq/L — ABNORMAL HIGH (ref 137–147)
Total Bilirubin: 0.5 mg/dL (ref 0.3–1.2)
Total Protein: 6.6 g/dL (ref 6.0–8.3)

## 2014-03-09 LAB — TROPONIN I
Troponin I: <0.3 ng/mL (ref <0.30)
Troponin I: <0.3 ng/mL (ref <0.30)
Troponin I: <0.3 ng/mL (ref <0.30)

## 2014-03-09 LAB — LEGIONELLA ANTIGEN, URINE: LEGIONELLA ANTIGEN, URINE: NEGATIVE

## 2014-03-09 LAB — MRSA PCR SCREENING: MRSA by PCR: NEGATIVE

## 2014-03-09 LAB — STREP PNEUMONIAE URINARY ANTIGEN: STREP PNEUMO URINARY ANTIGEN: NEGATIVE

## 2014-03-09 MED ORDER — SODIUM CHLORIDE 0.9 % IV SOLN
INTRAVENOUS | Status: DC
  Administered 2014-03-09: 13:00:00 via INTRAVENOUS

## 2014-03-09 MED ORDER — VALPROATE SODIUM 500 MG/5ML IV SOLN
375.0000 mg | Freq: Two times a day (BID) | INTRAVENOUS | Status: DC
  Administered 2014-03-09 – 2014-03-10 (×2): 375 mg via INTRAVENOUS
  Filled 2014-03-09 (×3): qty 3.75

## 2014-03-09 MED ORDER — BIOTENE DRY MOUTH MT LIQD
15.0000 mL | Freq: Two times a day (BID) | OROMUCOSAL | Status: DC
  Administered 2014-03-09 – 2014-03-10 (×3): 15 mL via OROMUCOSAL

## 2014-03-09 MED ORDER — CHLORHEXIDINE GLUCONATE 0.12 % MT SOLN
15.0000 mL | Freq: Two times a day (BID) | OROMUCOSAL | Status: DC
Start: 2014-03-09 — End: 2014-03-10
  Administered 2014-03-09 – 2014-03-10 (×2): 15 mL via OROMUCOSAL
  Filled 2014-03-09 (×5): qty 15

## 2014-03-09 NOTE — Progress Notes (Signed)
Per Wyline Copas MD, foley can be removed.

## 2014-03-09 NOTE — Progress Notes (Signed)
Speech Language Pathology     Patient Details Name: Phillip Gutierrez MRN: [id number] DOB: June 01, 1924 Today's Date: 03/09/2014 Time: 5009-3818 SLP Time Calculation (min): 17 min    Discussed results of swallow assessment with MD who reports he will speak with family regarding their wishes regarding nutrition.  SLP asked MD to place order with plan for po's and SLP will follow up.      Orbie Pyo Logan.Ed [employer] [phone]  03/09/2014

## 2014-03-09 NOTE — Progress Notes (Signed)
CARE MANAGEMENT NOTE 03/09/2014  Patient:  NIRAV, SWEDA   Account Number:  [id number]  Date Initiated:  03/09/2014  Documentation initiated by:  [facility]  Subjective/Objective Assessment:   admitted for sepsis  from [facility]     Action/Plan:   plan return to [facility]   Anticipated DC Date:     Anticipated DC Plan:  [place] referral  Clinical Social Worker      DC Planning Services  CM consult      Choice offered to / List presented to:             Status of service:  In process, will continue to follow Medicare Important Message given?   (If response is "NO", the following Medicare IM given date fields will be blank) Date Medicare IM given:   Medicare IM given by:   Date Additional Medicare IM given:   Additional Medicare IM given by:    Discharge Disposition:    Per UR Regulation:    If discussed at Long Length of Stay Meetings, dates discussed:    Comments:  03/09/14 Patient from [facility], referral made to [place]. CM will continue to follow. Fuller Plan RN, BSN, CCM

## 2014-03-09 NOTE — Progress Notes (Signed)
  Echocardiogram 2D Echocardiogram has been performed.  Phillip Gutierrez 03/09/2014, 12:18 PM

## 2014-03-09 NOTE — Evaluation (Signed)
Clinical/Bedside Swallow Evaluation Patient Details  Name: Phillip Gutierrez MRN: [id number] Date of Birth: April 06, 1924  Today's Date: 03/09/2014 Time: 2423-5361 SLP Time Calculation (min): 17 min  Past Medical History:  Past Medical History  Diagnosis Date  . Thyroid disease   . Dementia   . Parkinson disease    Past Surgical History: History reviewed. No pertinent past surgical history. HPI:  80 yr old admitted with "gurgling breath sounds" as he was eating dinner. Question of possible aspiration with piece of candy. CXR 7/26 showed bilateral edema? at the bases.  Repeat CXR 7/27 showed no infiltrate. Admitted for presumed aspiration PNA resulting in sepsis.  PMH:  dementia, thyroid disease.    Assessment / Plan / Recommendation Clinical Impression  Pt. with overt indications of oropharyngeal dysphagia exhibited by wet vocal quality at baseline, liklely aspirating secretions.  In addition decreased laryngeal elevation, suspected delay in swallow initiation, likely reduced tongue base retraction leading to residual evidenced by multiple swallows.  Pt. with cognitive deficits also decreasing safety with po's.  Will discuss results with MD for objective assessment versus diet modification with comfort feeds.    Aspiration Risk  Severe    Diet Recommendation NPO        Other  Recommendations Recommended Consults:  (TBD) Oral Care Recommendations: Oral care BID   Follow Up Recommendations  Skilled Nursing facility    Frequency and Duration min 1 x/week  2 weeks   Pertinent Vitals/Pain WDL         Swallow Study         Oral/Motor/Sensory Function Overall Oral Motor/Sensory Function:  (unable, decreased ROM and strength)   Ice Chips Ice chips: Impaired Presentation: Spoon Oral Phase Impairments: Reduced labial seal;Impaired anterior to posterior transit;Reduced lingual movement/coordination Oral Phase Functional Implications: Prolonged oral transit Pharyngeal Phase  Impairments: Suspected delayed Swallow;Decreased hyoid-laryngeal movement;Wet Vocal Quality   Thin Liquid Thin Liquid: Impaired Presentation: Spoon Oral Phase Impairments: Reduced labial seal;Impaired anterior to posterior transit;Reduced lingual movement/coordination Oral Phase Functional Implications:  (suspect premature spill) Pharyngeal  Phase Impairments: Decreased hyoid-laryngeal movement;Suspected delayed Swallow;Multiple swallows;Wet Vocal Quality    Nectar Thick Nectar Thick Liquid: Not tested   Honey Thick Honey Thick Liquid: Not tested   Puree Puree: Impaired Presentation: Spoon Oral Phase Impairments: Reduced labial seal;Impaired anterior to posterior transit;Reduced lingual movement/coordination Pharyngeal Phase Impairments: Suspected delayed Swallow;Decreased hyoid-laryngeal movement;Wet Vocal Quality   Solid   GO    Solid: Not tested       Houston Siren M.Ed [employer] [phone]  03/09/2014

## 2014-03-09 NOTE — Progress Notes (Signed)
Utilization review completed.  

## 2014-03-09 NOTE — Progress Notes (Signed)
[facility] PROGRESS NOTE  Phillip Gutierrez ZOX:[id number] DOB: Apr 03, 1924 DOA: 03/08/2014 PCP: Default, Provider, MD  Assessment/Plan: 1. Aspiration Pneumonia with sepsis 1. On vanc and zyson 2. SLP report reviewed. [place] aspiration noted. Will discuss with family. 2. Hypoxia 1. Secondary to above 2. Currently on min O2 support 3. Hypernatremia 1. Na more elevated overnight 2. Cont IVF as tolerated 4. Dementia 1. Stable 5. DVT prophylaxis 1. Lovenox  Code Status: DNR Family Communication: Pt in room Disposition Plan: Pending  Consultants:    Procedures:    Antibiotics:  Vanc 7/26>>>  Zosyn 7/26>>>  HPI/Subjective: No acute events reported overnight. Pt not conversant  Objective: Filed Vitals:   03/08/14 2340 03/09/14 0157 03/09/14 0626 03/09/14 1001  BP:   125/57 113/73  Pulse:   68 82  Temp:   98.8 F (37.1 C) 98.2 F (36.8 C)  TempSrc:   Axillary Oral  Resp:   22 20  Weight: 60.601 kg (133 lb 9.6 oz) 60.601 kg (133 lb 9.6 oz)    SpO2:   98% 97%    Intake/Output Summary (Last 24 hours) at 03/09/14 1216 Last data filed at 03/09/14 8119  Gross per 24 hour  Intake   2250 ml  Output    300 ml  Net   1950 ml   Filed Weights   03/08/14 2329 03/08/14 2340 03/09/14 0157  Weight: 60.601 kg (133 lb 9.6 oz) 60.601 kg (133 lb 9.6 oz) 60.601 kg (133 lb 9.6 oz)    Exam:   General:  Awake, in nad, not following commands  Cardiovascular: regular, s1, s2  Respiratory: normal resp effort, no wheezing  Abdomen: soft, nondistended  Musculoskeletal: perfused,no clubbing or cyanosis   Data Reviewed: Basic Metabolic Panel:  Recent Labs Lab 03/08/14 1830 03/09/14 0125  NA 149* 153*  K 4.2 4.2  CL 106 110  CO2 27 28  GLUCOSE 155* 147*  BUN 30* 26*  CREATININE 0.86 0.72  CALCIUM 9.4 8.7   Liver Function Tests:  Recent Labs Lab 03/08/14 1830 03/09/14 0125  AST 26 24  ALT 11 10  ALKPHOS 58 51  BILITOT 0.4 0.5  PROT 7.2 6.6  ALBUMIN  3.3* 2.9*   No results found for this basename: LIPASE, AMYLASE,  in the last 168 hours No results found for this basename: AMMONIA,  in the last 168 hours CBC:  Recent Labs Lab 03/08/14 1830  WBC 8.9  NEUTROABS 6.6  HGB 14.4  HCT 44.2  MCV 94.8  PLT 243   Cardiac Enzymes:  Recent Labs Lab 03/09/14 0125 03/09/14 0533 03/09/14 1119  TROPONINI <0.30 <0.30 <0.30   BNP (last 3 results) No results found for this basename: PROBNP,  in the last 8760 hours CBG: No results found for this basename: GLUCAP,  in the last 168 hours  Recent Results (from the past 240 hour(s))  MRSA PCR SCREENING     Status: None   Collection Time    03/08/14 11:36 PM      Result Value Ref Range Status   MRSA by PCR NEGATIVE  NEGATIVE Final   Comment:            The GeneXpert MRSA Assay (FDA     approved for NASAL specimens     only), is one component of a     comprehensive MRSA colonization     surveillance program. It is not     intended to diagnose MRSA     infection nor to guide or  monitor treatment for     MRSA infections.     Studies: Dg Chest 2 View  03/09/2014   CLINICAL DATA:  Shortness of breath. Generalize weakness. Followup left basilar atelectasis.  EXAM: CHEST  2 VIEW  COMPARISON:  Portable chest x-ray yesterday. Two-view chest x-ray 10/10/2006.  FINDINGS: Cardiac silhouette normal in size, unchanged. Thoracic aorta atherosclerotic, unchanged. Hilar and mediastinal contours otherwise unremarkable. Improved aeration in the left lung base with resolution of the previously identified atelectasis. Lungs now clear. No pleural effusions. No pneumothorax.  IMPRESSION: Resolution of left basilar atelectasis. No acute cardiopulmonary disease.   Electronically Signed   By: Evangeline Dakin M.D.   On: 03/09/2014 07:54   Ct Head Wo Contrast  03/08/2014   CLINICAL DATA:  Mental status change.  EXAM: CT HEAD WITHOUT CONTRAST  TECHNIQUE: Contiguous axial images were obtained from the base of  the skull through the vertex without intravenous contrast.  COMPARISON:  10/10/2006.  FINDINGS: Progressive age related cerebral atrophy, ventriculomegaly and periventricular white matter disease. No extra-axial fluid collections. No CT findings for acute hemispheric infarction an or intracranial hemorrhage. No mass lesions. The brainstem and cerebellum are grossly normal.  No acute bony findings. Minimal scattered sinus disease. Scattered mastoid effusions.  IMPRESSION: Age related cerebral atrophy, ventriculomegaly and periventricular white matter disease.  No acute intracranial findings.   Electronically Signed   By: Kalman Jewels M.D.   On: 03/08/2014 20:36   Dg Chest Port 1 View  03/08/2014   CLINICAL DATA:  Shortness of breath.  EXAM: PORTABLE CHEST - 1 VIEW  COMPARISON:  10/10/2006  FINDINGS: Heart size and pulmonary vascularity are normal. There is slight elevation of the left hemidiaphragm with minimal atelectasis at the left lung base. Right lung is clear. No effusions. No acute osseous abnormality.  IMPRESSION: Slight atelectasis at the left lung base.   Electronically Signed   By: Rozetta Nunnery M.D.   On: 03/08/2014 19:16    Scheduled Meds: . antiseptic oral rinse  15 mL Mouth Rinse q12n4p  . carbidopa-levodopa  1 tablet Oral TID AC  . chlorhexidine  15 mL Mouth Rinse BID  . divalproex  375 mg Oral BID  . enoxaparin (LOVENOX) injection  40 mg Subcutaneous Q24H  . escitalopram  15 mg Oral Daily  . levothyroxine  125 mcg Intravenous Daily  . liothyronine  50 mcg Oral Daily  . piperacillin-tazobactam (ZOSYN)  IV  3.375 g Intravenous 3 times per day  . senna  1 tablet Oral QHS  . sodium chloride  3 mL Intravenous Q12H  . sodium chloride  3 mL Intravenous Q12H  . vancomycin  500 mg Intravenous Q12H   Continuous Infusions:   Active Problems:   Alzheimer's disease   Aspiration pneumonia   Hypoxia   Sepsis   Hypernatremia  Time spent: 27min  Martia Dalby, [place]  [facility] Pager [phone]. If 7PM-7AM, please contact night-coverage at [email or web address], password [facility] 03/09/2014, 12:16 PM  LOS: 1 day

## 2014-03-10 ENCOUNTER — Non-Acute Institutional Stay (SKILLED_NURSING_FACILITY): Payer: Medicare Other | Admitting: Internal Medicine

## 2014-03-10 DIAGNOSIS — G20A1 Parkinson's disease without dyskinesia, without mention of fluctuations: Secondary | ICD-10-CM

## 2014-03-10 DIAGNOSIS — Z515 Encounter for palliative care: Secondary | ICD-10-CM

## 2014-03-10 DIAGNOSIS — G20 Parkinson's disease: Secondary | ICD-10-CM

## 2014-03-10 DIAGNOSIS — K5900 Constipation, unspecified: Secondary | ICD-10-CM

## 2014-03-10 DIAGNOSIS — J690 Pneumonitis due to inhalation of food and vomit: Secondary | ICD-10-CM

## 2014-03-10 LAB — BASIC METABOLIC PANEL
Anion gap: 10 (ref 5–15)
BUN: 21 mg/dL (ref 6–23)
CHLORIDE: 114 meq/L — AB (ref 96–112)
CO2: 26 mEq/L (ref 19–32)
Calcium: 8.6 mg/dL (ref 8.4–10.5)
Creatinine, Ser: 0.65 mg/dL (ref 0.50–1.35)
GFR calc Af Amer: >90 mL/min (ref >90)
GFR, EST NON AFRICAN AMERICAN: 84 mL/min — AB (ref >90)
Glucose, Bld: 111 mg/dL — ABNORMAL HIGH (ref 70–99)
POTASSIUM: 3.8 meq/L (ref 3.7–5.3)
Sodium: 150 mEq/L — ABNORMAL HIGH (ref 137–147)

## 2014-03-10 LAB — URINE CULTURE
Colony Count: NO GROWTH
Culture: NO GROWTH

## 2014-03-10 MED ORDER — BIOTENE DRY MOUTH MT LIQD: 15.0000 mL | OROMUCOSAL | Status: AC

## 2014-03-10 MED ORDER — MORPHINE SULFATE (CONCENTRATE) 10 MG /0.5 ML PO SOLN: 5.0000 mg | ORAL | Status: DC | PRN

## 2014-03-10 MED ORDER — BIOTENE DRY MOUTH MT LIQD: 15.0000 mL | OROMUCOSAL | Status: DC

## 2014-03-10 MED ORDER — ATROPINE SULFATE 1 % OP SOLN: 4.0000 [drp] | OPHTHALMIC | Status: AC | PRN

## 2014-03-10 MED ORDER — ATROPINE SULFATE 1 % OP SOLN
4.0000 [drp] | OPHTHALMIC | Status: DC | PRN
  Filled 2014-03-10: qty 2

## 2014-03-10 MED ORDER — LORAZEPAM 2 MG/ML PO CONC: 0.5000 mg | ORAL | Status: DC | PRN

## 2014-03-10 MED ORDER — MORPHINE SULFATE (CONCENTRATE) 10 MG /0.5 ML PO SOLN
5.0000 mg | ORAL | Status: DC | PRN
  Administered 2014-03-10: 5 mg via ORAL
  Filled 2014-03-10: qty 0.5

## 2014-03-10 MED ORDER — LORAZEPAM 2 MG/ML PO CONC: 0.5000 mg | ORAL | Status: AC | PRN

## 2014-03-10 MED ORDER — ACETAMINOPHEN 650 MG RE SUPP: 650.0000 mg | RECTAL | Status: AC | PRN

## 2014-03-10 NOTE — Discharge Summary (Signed)
Physician Discharge Summary  Phillip Gutierrez GPQ:[id number] DOB: 12-20-1923 DOA: 03/08/2014  PCP: Default, Provider, MD  Admit date: 03/08/2014 Discharge date: 03/10/2014  Time spent: 35 minutes  Recommendations for Outpatient Follow-up:  1. Follow up with PCP  2. DNR, comfort care only  Discharge Diagnoses:  Active Problems:   Alzheimer's disease   Aspiration pneumonia   Hypoxia   Sepsis   Hypernatremia   Discharge Condition: Stable  Diet recommendation: Comfort as needed  Filed Weights   03/08/14 2340 03/09/14 0157 03/10/14 0424  Weight: 60.601 kg (133 lb 9.6 oz) 60.601 kg (133 lb 9.6 oz) 61.188 kg (134 lb 14.3 oz)    History of present illness:  See admit h and p from 7/26 for details. Briefly, pt with a hx of advanced dementia and parkinson's disease who presented with aspiration pneumonia and hypoxia. Pt admitted for further work up.  Hospital Course:  1. Aspiration Pneumonia with sepsis  1. Was empirically started on vanc and zyson 2. SLP report reviewed. [place] aspiration noted. Family is aware of risks of continued re-aspiration 3. Consulted Palliative Care, who met with family. Family has decided on comfort measures only. Pt will be discharged back to SNF with comfort care only. 2. Hypoxia  1. Secondary to above 2. On min O2 support while in the hospital 3. Hypernatremia  1. Pt was continued IVF while inpatient 4. Dementia  1. Stable 5. DVT prophylaxis  1. Lovenox while inpatient  Consultations:  Palliative Care  Discharge Exam: Filed Vitals:   03/09/14 1804 03/09/14 2018 03/09/14 2354 03/10/14 0424  BP:  150/73 123/66 126/64  Pulse:  65 60 64  Temp:  98.3 F (36.8 C) 98.2 F (36.8 C) 98.2 F (36.8 C)  TempSrc:  Oral Axillary Axillary  Resp:  _0 Weight:    61.188 kg (134 lb 14.3 oz)  SpO2: 100% 99% 97% 96%   General: Awake, in nad Cardiovascular: regular, s1, s2 Respiratory: slightly increased resp effort, no wheezing  Discharge  Instructions    Medication List    STOP taking these medications       carbidopa-levodopa 25-250 MG per tablet  Commonly known as:  SINEMET IR     escitalopram 10 MG tablet  Commonly known as:  LEXAPRO     escitalopram 5 MG tablet  Commonly known as:  LEXAPRO     levothyroxine 125 MCG tablet  Commonly known as:  SYNTHROID, LEVOTHROID     liothyronine 50 MCG tablet  Commonly known as:  CYTOMEL     ROCEPHIN 1 G injection  Generic drug:  cefTRIAXone     senna 8.6 MG Tabs tablet  Commonly known as:  SENOKOT      TAKE these medications       acetaminophen 650 MG suppository  Commonly known as:  TYLENOL  Place 1 suppository (650 mg total) rectally every 4 (four) hours as needed for mild pain, moderate pain or fever.     divalproex 125 MG capsule  Commonly known as:  DEPAKOTE SPRINKLE  Take 375 mg by mouth 2 (two) times daily.     furosemide 10 MG/ML solution  Commonly known as:  LASIX  Take 20 mg by mouth once.       Allergies  Allergen Reactions  . Zoloft [Sertraline Hcl]     NH MAR   Follow-up Information   Follow up with Follow up with PCP as needed.       The results of significant diagnostics  from this hospitalization (including imaging, microbiology, ancillary and laboratory) are listed below for reference.    Significant Diagnostic Studies: Dg Chest 2 View  03/09/2014   CLINICAL DATA:  Shortness of breath. Generalize weakness. Followup left basilar atelectasis.  EXAM: CHEST  2 VIEW  COMPARISON:  Portable chest x-ray yesterday. Two-view chest x-ray 10/10/2006.  FINDINGS: Cardiac silhouette normal in size, unchanged. Thoracic aorta atherosclerotic, unchanged. Hilar and mediastinal contours otherwise unremarkable. Improved aeration in the left lung base with resolution of the previously identified atelectasis. Lungs now clear. No pleural effusions. No pneumothorax.  IMPRESSION: Resolution of left basilar atelectasis. No acute cardiopulmonary disease.    Electronically Signed   By: Evangeline Dakin M.D.   On: 03/09/2014 07:54   Ct Head Wo Contrast  03/08/2014   CLINICAL DATA:  Mental status change.  EXAM: CT HEAD WITHOUT CONTRAST  TECHNIQUE: Contiguous axial images were obtained from the base of the skull through the vertex without intravenous contrast.  COMPARISON:  10/10/2006.  FINDINGS: Progressive age related cerebral atrophy, ventriculomegaly and periventricular white matter disease. No extra-axial fluid collections. No CT findings for acute hemispheric infarction an or intracranial hemorrhage. No mass lesions. The brainstem and cerebellum are grossly normal.  No acute bony findings. Minimal scattered sinus disease. Scattered mastoid effusions.  IMPRESSION: Age related cerebral atrophy, ventriculomegaly and periventricular white matter disease.  No acute intracranial findings.   Electronically Signed   By: Kalman Jewels M.D.   On: 03/08/2014 20:36   Dg Chest Port 1 View  03/08/2014   CLINICAL DATA:  Shortness of breath.  EXAM: PORTABLE CHEST - 1 VIEW  COMPARISON:  10/10/2006  FINDINGS: Heart size and pulmonary vascularity are normal. There is slight elevation of the left hemidiaphragm with minimal atelectasis at the left lung base. Right lung is clear. No effusions. No acute osseous abnormality.  IMPRESSION: Slight atelectasis at the left lung base.   Electronically Signed   By: Rozetta Nunnery M.D.   On: 03/08/2014 19:16    Microbiology: Recent Results (from the past 240 hour(s))  CULTURE, BLOOD (ROUTINE X 2)     Status: None   Collection Time    03/08/14  6:30 PM      Result Value Ref Range Status   Specimen Description BLOOD BLOOD RIGHT FOREARM   Final   Special Requests BOTTLES DRAWN AEROBIC AND ANAEROBIC 6 CC   Final   Culture  Setup Time     Final   Value: 03/09/2014 02:09     Performed at [facility]   Culture     Final   Value:        BLOOD CULTURE RECEIVED NO GROWTH TO DATE CULTURE WILL BE HELD FOR 5 DAYS BEFORE ISSUING A  FINAL NEGATIVE REPORT     Performed at [facility]   Report Status PENDING   Incomplete  CULTURE, BLOOD (ROUTINE X 2)     Status: None   Collection Time    03/08/14  6:45 PM      Result Value Ref Range Status   Specimen Description BLOOD BLOOD LEFT FOREARM   Final   Special Requests BOTTLES DRAWN AEROBIC AND ANAEROBIC 6 CC   Final   Culture  Setup Time     Final   Value: 03/09/2014 02:09     Performed at [facility]   Culture     Final   Value:        BLOOD CULTURE RECEIVED NO GROWTH TO DATE CULTURE  WILL BE HELD FOR 5 DAYS BEFORE ISSUING A FINAL NEGATIVE REPORT     Performed at [facility]   Report Status PENDING   Incomplete  URINE CULTURE     Status: None   Collection Time    03/08/14  7:04 PM      Result Value Ref Range Status   Specimen Description URINE, RANDOM   Final   Special Requests NONE   Final   Culture  Setup Time     Final   Value: 03/09/2014 02:54     Performed at [place]     Final   Value: NO GROWTH     Performed at [facility]   Culture     Final   Value: NO GROWTH     Performed at [facility]   Report Status 03/10/2014 FINAL   Final  MRSA PCR SCREENING     Status: None   Collection Time    03/08/14 11:36 PM      Result Value Ref Range Status   MRSA by PCR NEGATIVE  NEGATIVE Final   Comment:            The GeneXpert MRSA Assay (FDA     approved for NASAL specimens     only), is one component of a     comprehensive MRSA colonization     surveillance program. It is not     intended to diagnose MRSA     infection nor to guide or     monitor treatment for     MRSA infections.     Labs: Basic Metabolic Panel:  Recent Labs Lab 03/08/14 1830 03/09/14 0125 03/10/14 0600  NA 149* 153* 150*  K 4.2 4.2 3.8  CL 106 110 114*  CO2 _0 GLUCOSE 155* 147* 111*  BUN 30* 26* 21  CREATININE 0.86 0.72 0.65  CALCIUM 9.4 8.7 8.6   Liver Function Tests:  Recent Labs Lab  03/08/14 1830 03/09/14 0125  AST 26 24  ALT 11 10  ALKPHOS 58 51  BILITOT 0.4 0.5  PROT 7.2 6.6  ALBUMIN 3.3* 2.9*   No results found for this basename: LIPASE, AMYLASE,  in the last 168 hours No results found for this basename: AMMONIA,  in the last 168 hours CBC:  Recent Labs Lab 03/08/14 1830  WBC 8.9  NEUTROABS 6.6  HGB 14.4  HCT 44.2  MCV 94.8  PLT 243   Cardiac Enzymes:  Recent Labs Lab 03/09/14 0125 03/09/14 0533 03/09/14 1119  TROPONINI <0.30 <0.30 <0.30   BNP: BNP (last 3 results) No results found for this basename: PROBNP,  in the last 8760 hours CBG: No results found for this basename: GLUCAP,  in the last 168 hours  Signed:  Skipper Dacosta K  [facility] 03/10/2014, 11:25 AM

## 2014-03-10 NOTE — Progress Notes (Signed)
Patient ID: Phillip Gutierrez, male   DOB: Dec 20, 1923, 80 y.o.   MRN: [id number] [facility] Chief complaint readmission to the facility post [place] History; this is a patient who has been in the building I believe since 2008 he has a history of Parkinson's disease, progressive dementia. He lives in the facility with his wife. He was sent to the hospital with shortness of breath and fever. It appeared he was ultimately diagnosed with an aspiration pneumonitis and was noted to have recurrent severe aspiration. He arrived back in the facility with the patient's family already asking about his "pain medications". According to the family he is taking nothing orally.  Past medical history; this is reviewed and includes Progressive Parkinson's dementia which is advanced #2 history of prostate cancer #3 arthritis #4 rosacea  Review of systems not really possible  Physical exam Gen. the patient really does not look back comfortable eat. He is tachypneic. Vital signs O2 sat is 95% on 2 L respirations 26 pulse rate 85 Respiratory decreased air entry bilaterally Cardiac; he already looks volume contracted. His discharge sodium was 150 from the hospital  Impression/plan #1 he apparently was not being fed in the hospital nor was he given any medications yet he comes here with oral medications ordered. I will discontinue these I put him on regular Roxanol 5 mg every 4 and every 2 when necessary. I've written a prescription so the facility can pick this up at a local pharmacy. #2 severe and recurrent aspiration per the hospital. #3 already has some degree of clinical dehydration #4 Parkinson's dementia complex.  I have written hospice related orders. He may have comfort liquids and feeding. Roxanol orders written. No other medications at this point really make any clinical sense

## 2014-03-10 NOTE — Consult Note (Signed)
Patient Phillip Gutierrez      DOB: Feb 20, 1924      ZGY:[id number]     Consult Note from the Palliative Medicine Team at [facility] Requested by: Dr. Earlie Gutierrez     PCP: Default, Provider, MD Reason for Consultation: Phillip Gutierrez and options    Phone Number:None  Assessment of patients Current state: I met with Phillip Gutierrez' son, Phillip Gutierrez, today at bedside. He has spoken with Dr. Wyline Gutierrez and understands the severity of his aspiration. Phillip Gutierrez tells me about his father's advanced Parkinson's dementia and poor quality of life. He has been living at [facility] and shares a room with his wife there. Phillip Gutierrez has already declined so severely and appears to be aspirating on his own secretions. He is not responsive to me. I discussed with Phillip Gutierrez that there is nothing we can do to reverse his father's dysphagia or improve his quality of life. Phillip Gutierrez agrees that we should pursue comfort measures. Confirmed DNR and no IVF, labs, antibiotics. We discussed his limited time to hours to days. We discussed where this care should take place with options of hospice (likely qualify hospice GIP status) or hospice to support in [facility]. Phillip Gutierrez decided that it would be important to get him back to [facility] with hospice to aide in end of life so that Phillip Gutierrez is able to be with him during this time. We agree that we should get him transferred sooner than later as his time is limited and I expect he will continue to decline quickly and I would not want to chance him dying in transport. Phillip Gutierrez agrees this is the best plan for his father and family.    Goals of Care: 1.  Code Status: DNR   2. Scope of Treatment: 1. Vital Signs: daily  2. Respiratory/Oxygen: for comfort 3. Nutritional Support/Tube Feeds: no 4. Antibiotics: no 5. Blood Products: no 6. IVF: no 7. Review of Medications to be discontinued: minimize for comfort 8. Labs: no 9. Telemetry: no   4. Disposition: Return to [facility] with hospice.    3. Symptom  Management:   1. Anxiety/Agitation: Lorazepam 0.5 mg every 4 hours prn.  2. Pain/dyspnea: Roxanol 5 mg every hour prn.  3. Terminal Secretions: Atropine 4 drops SL every 4 hours prn.   4. Psychosocial: Emotional support provided to patient and family.     Patient Documents Completed or Given: Document Given Completed  Advanced Directives Pkt    MOST    DNR yes   Gone from [facility]    Hard Choices      Brief HPI: Phillip Gutierrez is a 80 yo male with pneumonia, Parkinson's dementia, h/o prostate cancer, arthritis at end of life.    ROS: Unable to assess - encephalopathy.     PMH:  Past Medical History  Diagnosis Date  . Thyroid disease   . Dementia   . Parkinson disease      RFF:[id number] reviewed. No pertinent past surgical history. I have reviewed the [place] and SH and  If appropriate update it with new information. Allergies  Allergen Reactions  . Zoloft [Sertraline Hcl]     NH MAR   Scheduled Meds: . antiseptic oral rinse  15 mL Mouth Rinse q12n4p  . carbidopa-levodopa  1 tablet Oral TID AC  . chlorhexidine  15 mL Mouth Rinse BID  . enoxaparin (LOVENOX) injection  40 mg Subcutaneous Q24H  . escitalopram  15 mg Oral Daily  . levothyroxine  125 mcg Intravenous Daily  . liothyronine  50 mcg Oral Daily  . piperacillin-tazobactam (ZOSYN)  IV  3.375 g Intravenous 3 times per day  . senna  1 tablet Oral QHS  . sodium chloride  3 mL Intravenous Q12H  . sodium chloride  3 mL Intravenous Q12H  . valproate sodium  375 mg Intravenous Q12H  . vancomycin  500 mg Intravenous Q12H   Continuous Infusions: . sodium chloride 100 mL/hr at 03/09/14 1306   PRN Meds:.sodium chloride, sodium chloride, acetaminophen, ondansetron (ZOFRAN) IV, sodium chloride, sodium chloride    BP 126/64  Pulse 64  Temp(Src) 98.2 F (36.8 C) (Axillary)  Resp 24  Wt 61.188 kg (134 lb 14.3 oz)  SpO2 96%   PPS: 10%   Intake/Output Summary (Last 24 hours) at 03/10/14 1116 Last data filed at  03/10/14 0946  Gross per 24 hour  Intake 951.75 ml  Output    900 ml  Net  51.75 ml    Physical Exam:  General: Slight resp distress, no grimace HEENT: [place]/AT, dry oral mucosa, no JVD Chest: Rhonchi and diminished, slightly labored, tachypneic CVS: RRR, S1 S2 Abdomen: Soft, NT, ND, hypoactive BS Ext: No edema, warm to touch Neuro: Awake, eyes open, otherwise unresponsive  Labs: CBC    Component Value Date/Time   WBC 8.9 03/08/2014 1830   RBC 4.66 03/08/2014 1830   HGB 14.4 03/08/2014 1830   HCT 44.2 03/08/2014 1830   PLT 243 03/08/2014 1830   MCV 94.8 03/08/2014 1830   MCH 30.9 03/08/2014 1830   MCHC 32.6 03/08/2014 1830   RDW 12.8 03/08/2014 1830   LYMPHSABS 1.3 03/08/2014 1830   MONOABS 1.0 03/08/2014 1830   EOSABS 0.0 03/08/2014 1830   BASOSABS 0.0 03/08/2014 1830    BMET    Component Value Date/Time   NA 150* 03/10/2014 0600   K 3.8 03/10/2014 0600   CL 114* 03/10/2014 0600   CO2 26 03/10/2014 0600   GLUCOSE 111* 03/10/2014 0600   BUN 21 03/10/2014 0600   CREATININE 0.65 03/10/2014 0600   CALCIUM 8.6 03/10/2014 0600   GFRNONAA 84* 03/10/2014 0600   GFRAA >90 03/10/2014 0600    CMP     Component Value Date/Time   NA 150* 03/10/2014 0600   K 3.8 03/10/2014 0600   CL 114* 03/10/2014 0600   CO2 26 03/10/2014 0600   GLUCOSE 111* 03/10/2014 0600   BUN 21 03/10/2014 0600   CREATININE 0.65 03/10/2014 0600   CALCIUM 8.6 03/10/2014 0600   PROT 6.6 03/09/2014 0125   ALBUMIN 2.9* 03/09/2014 0125   AST 24 03/09/2014 0125   ALT 10 03/09/2014 0125   ALKPHOS 51 03/09/2014 0125   BILITOT 0.5 03/09/2014 0125   GFRNONAA 84* 03/10/2014 0600   GFRAA >90 03/10/2014 0600     Time In Time Out Total Time Spent with Patient Total Overall Time  1015 1125 65mn 714m    Greater than 50%  of this time was spent counseling and coordinating care related to the above assessment and plan.  Phillip SillNP Palliative Medicine Team Pager # [phone]-F 8a-5p) Team Phone # [phone](Nights/Weekends)

## 2014-03-10 NOTE — Progress Notes (Signed)
Report called and given to April at [facility]

## 2014-03-10 NOTE — Care Management Note (Signed)
    Page 1 of 1   03/10/2014     2:55:31 PM CARE MANAGEMENT NOTE 03/10/2014  Patient:  Phillip Gutierrez, Phillip Gutierrez   Account Number:  [id number]  Date Initiated:  03/09/2014  Documentation initiated by:  [facility]  Subjective/Objective Assessment:   admitted for sepsis  from [facility]     Action/Plan:   plan return to [facility]   Anticipated DC Date:  03/10/2014   Anticipated DC Plan:  [place]  In-house referral  Clinical Social Worker      DC Planning Services  CM consult      [facility] Choice  HOSPICE   Choice offered to / List presented to:             Status of service:  Completed, signed off Medicare Important Message given?  NA - LOS <3 / Initial given by admissions (If response is "NO", the following Medicare IM given date fields will be blank) Date Medicare IM given:   Medicare IM given by:   Date Additional Medicare IM given:   Additional Medicare IM given by:    Discharge Disposition:  [place]  Per UR Regulation:  Reviewed for med. necessity/level of care/duration of stay  If discussed at [place] of Stay Meetings, dates discussed:    Comments:  03/10/14 [place], BSN [phone] patient is for dc to snf with Hospice back to [facility] [place] aware.  03/09/14 Patient from [facility], referral made to [place]. CM will continue to follow. Fuller Plan RN, BSN, CCM

## 2014-03-10 NOTE — Clinical Social Work Note (Signed)
Per MD patient ready for Dc back to  [facility] with [facility]. RN, patient's son Barnabas Lister, and facility made aware of DC. RN given number for report. Dc packet on chart. Ambulance transport requested. CSW signing off at this time.  Liz Beach MSW, [place], [place], [phone]

## 2014-03-10 NOTE — Discharge Summary (Signed)
Neal Dy to be D/C'd Skilled nursing facility per MD order.  Discussed with the patient and all questions fully answered.    Medication List    STOP taking these medications       carbidopa-levodopa 25-250 MG per tablet  Commonly known as:  SINEMET IR     escitalopram 10 MG tablet  Commonly known as:  LEXAPRO     escitalopram 5 MG tablet  Commonly known as:  LEXAPRO     levothyroxine 125 MCG tablet  Commonly known as:  SYNTHROID, LEVOTHROID     liothyronine 50 MCG tablet  Commonly known as:  CYTOMEL     ROCEPHIN 1 G injection  Generic drug:  cefTRIAXone     senna 8.6 MG Tabs tablet  Commonly known as:  SENOKOT      TAKE these medications       acetaminophen 650 MG suppository  Commonly known as:  TYLENOL  Place 1 suppository (650 mg total) rectally every 4 (four) hours as needed for mild pain, moderate pain or fever.     antiseptic oral rinse Liqd  15 mLs by Mouth Rinse route every 4 (four) hours.     atropine 1 % ophthalmic solution  Place 4 drops under the tongue every 4 (four) hours as needed (terminal secretions).     divalproex 125 MG capsule  Commonly known as:  DEPAKOTE SPRINKLE  Take 375 mg by mouth 2 (two) times daily.     furosemide 10 MG/ML solution  Commonly known as:  LASIX  Take 20 mg by mouth once.     LORazepam 2 MG/ML concentrated solution  Commonly known as:  ATIVAN  Take 0.3 mLs (0.6 mg total) by mouth every 4 (four) hours as needed for anxiety.     morphine CONCENTRATE 10 mg / 0.5 ml concentrated solution  Take 0.25 mLs (5 mg total) by mouth every 2 (two) hours as needed for moderate pain or shortness of breath.        VVS, Skin clean, dry and intact without evidence of skin break down, no evidence of skin tears noted. IV catheter discontinued intact. Site without signs and symptoms of complications. Dressing and pressure applied.   Packet sent with EMS to [facility]  Patient escorted via stretcher, and D/C SNF via  EMS.  Audria Nine F 03/10/2014 2:20 PM

## 2014-03-10 NOTE — Clinical Social Work Note (Signed)
CSW has attempted patient's family to complete assessment as there is currently no family at bedside. CSW has contacted [facility] who states that the patient is welcome to return to SNF with hospice, if the family wants this.  Liz Beach MSW, [place], [place], [phone]

## 2014-03-10 NOTE — Progress Notes (Signed)
Speech Language Pathology  Patient Details Name: Phillip Gutierrez MRN: [id number] DOB: 09-23-1923 Today's Date: 03/10/2014 Time:  -     SLP followed up with MD who reported pt.'s family wished to proceed with comfort care/feeds and is to be discharged with hospice.  Diet recommendation would be puree and  Nectar thick liquids (pt.'s cough may be too excessive with thin and uncomfortable for him?) with general swallow precautions.  Phillip Gutierrez [place].[employer] [phone]  03/10/2014

## 2014-03-11 ENCOUNTER — Other Ambulatory Visit: Payer: Self-pay | Admitting: *Deleted

## 2014-03-11 ENCOUNTER — Non-Acute Institutional Stay (SKILLED_NURSING_FACILITY): Payer: Medicare Other | Admitting: Internal Medicine

## 2014-03-11 DIAGNOSIS — N390 Urinary tract infection, site not specified: Secondary | ICD-10-CM

## 2014-03-11 MED ORDER — MORPHINE SULFATE (CONCENTRATE) 10 MG /0.5 ML PO SOLN: 5.0000 mg | ORAL | Status: AC | PRN

## 2014-03-11 NOTE — Telephone Encounter (Signed)
[facility] 

## 2014-03-11 NOTE — Clinical Social Work Psychosocial (Signed)
Clinical Social Work Department BRIEF PSYCHOSOCIAL ASSESSMENT 03/11/2014  Patient:  Phillip Gutierrez, Phillip Gutierrez     Account Number:  [id number]     Admit date:  03/08/2014  Clinical Social Worker:  Lovey Newcomer  Date/Time:  03/10/2014 10:00 AM  Referred by:  Physician  Date Referred:  03/10/2014 Referred for  SNF Placement  Residential hospice placement   Other Referral:   Interview type:  Family Other interview type:   Patient's son interviewed    PSYCHOSOCIAL DATA Living Status:  FACILITY Admitted from facility:  [facility] Level of care:  [place] Primary support name:  Phillip Gutierrez Primary support relationship to patient:  CHILD, ADULT Degree of support available:   Support is good    CURRENT CONCERNS Current Concerns  Post-Acute Placement   Other Concerns:    SOCIAL WORK ASSESSMENT / PLAN CSW spoke with patient's son by phone to complete assessment. Patient's son wishes for patient to return to [facility] with hospice services to receive EOL care. Patient has lived at facility with his wife for over 8 years and the family is happy with the care the patient receives at the facility. CSW will assist with DC. Patient's son seems saddened by the difficult choices he has had to make regarding his father's care.   Assessment/plan status:  Psychosocial Support/Ongoing Assessment of Needs Other assessment/ plan:   Complete FL2, Fax, PASRR   Information/referral to community resources:   CSW contact information given.    PATIENT'S/FAMILY'S RESPONSE TO PLAN OF CARE: Patient's son Phillip Gutierrez plans for patient to DC to [facility] with hospice services today. CSW will assist.       Liz Beach MSW, [place], [place], [phone]

## 2014-03-12 ENCOUNTER — Other Ambulatory Visit: Payer: Self-pay | Admitting: *Deleted

## 2014-03-12 NOTE — Progress Notes (Signed)
         PROGRESS NOTE  DATE: 03/11/2014  FACILITY:  [facility]  LEVEL OF CARE: SNF (31)  Acute Visit  CHIEF COMPLAINT:  Manage UTI  HISTORY OF PRESENT ILLNESS: I was requested by the staff to assess the patient regarding above problem(s):  On 03-08-14 patient's urinalysis shows turbid appearance, nitrite negative, WBC 0-5, RBC 0-2. Urine culture grew Escherichia coli significantly. Patient is actively dying and does not follow commands.  PAST MEDICAL HISTORY : Reviewed.  No changes/see problem list  CURRENT MEDICATIONS: Reviewed per MAR/see medication list  PHYSICAL EXAMINATION  VS: see VS section  GENERAL: no acute distress, normal body habitus GI: abdomen soft, normal BS, no masses, no tenderness, no hepatomegaly, no splenomegaly PSYCHIATRIC: the patient is minimally alert and disoriented, depressed affect & behavior appropriate  LABS/RADIOLOGY: See history of present illness  ASSESSMENT/PLAN:  UTI-patient was started on rocephin. He is on comfort measures.  CPT CODE: 37543  Gayani Y Dasanayaka, [place] [phone]

## 2014-03-15 LAB — CULTURE, BLOOD (ROUTINE X 2)
CULTURE: NO GROWTH
Culture: NO GROWTH

## 2014-03-17 NOTE — Consult Note (Signed)
I have reviewed and discussed case with Nurse Practitioner And agree with documentation and plan as noted above   Danile Trier J. Keyonni Percival D.O.  Palliative Medicine Team at [facility]  Team Phone: [phone]    

## 2014-04-14 DEATH — deceased

## 2015-03-15 IMAGING — CT CT HEAD W/O CM
1 series · 16 of 30 positions shown, 20 images · non-contrast
Comparison: 10/10/2006.

CLINICAL DATA: Mental status change.

EXAM:
CT HEAD WITHOUT CONTRAST
TECHNIQUE: Contiguous axial images were obtained from the base of the skull
through the vertex without intravenous contrast.

[Series 2: head 5.0 h30s · axial · 0.45mm/px · z∈[+66,+216]mm · 16 of 34 slices shown, 20 images]
[im 2/34  brain]
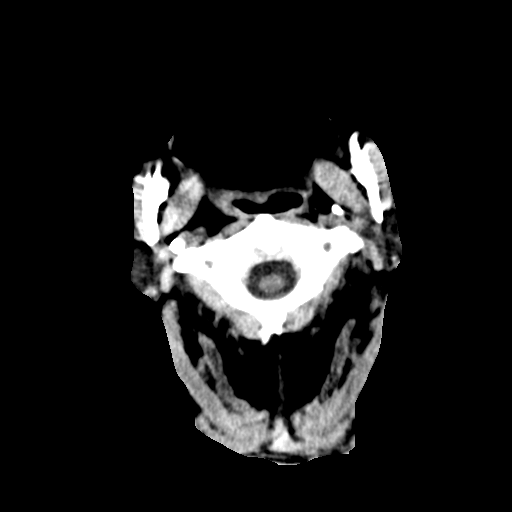
[im 2/34  bone]
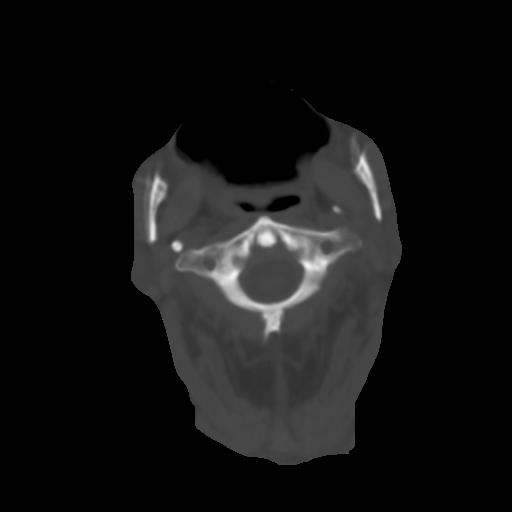
[im 4/34  brain]
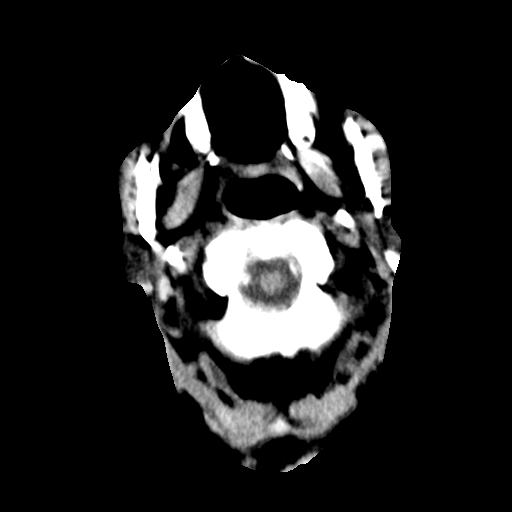
[im 6/34  brain]
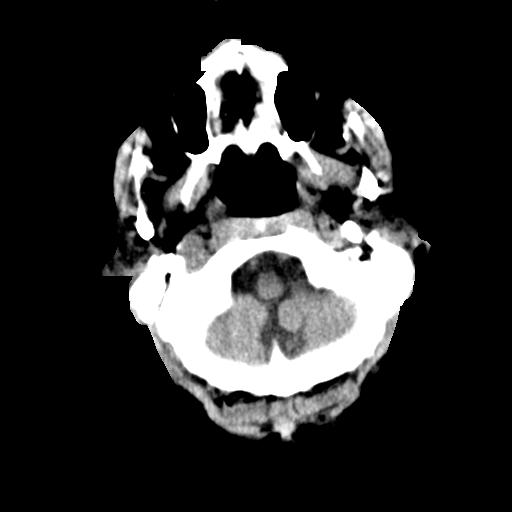
[im 8/34  brain]
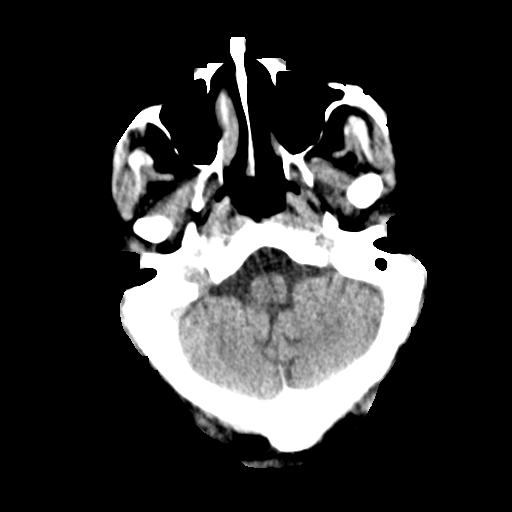
[im 10/34  brain]
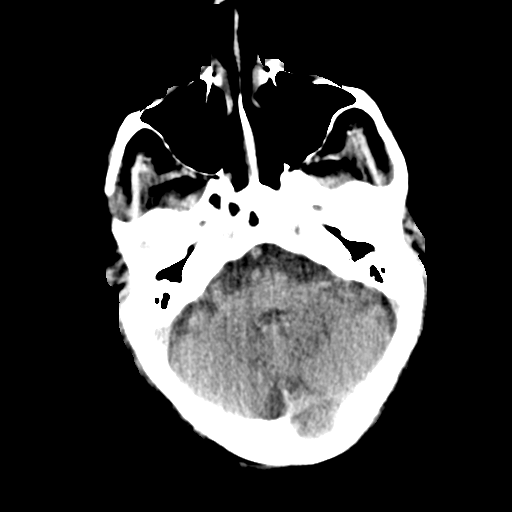
[im 10/34  bone]
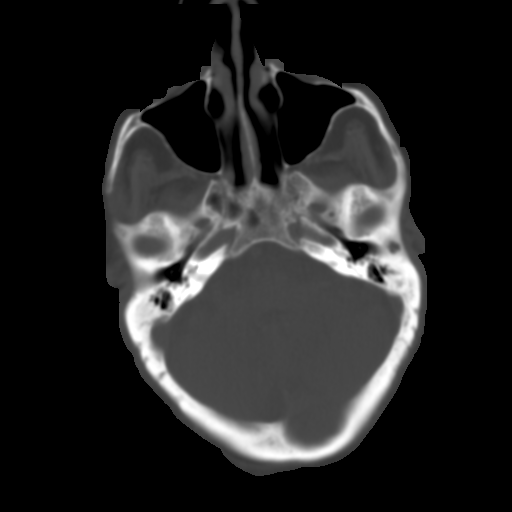
[im 12/34  brain]
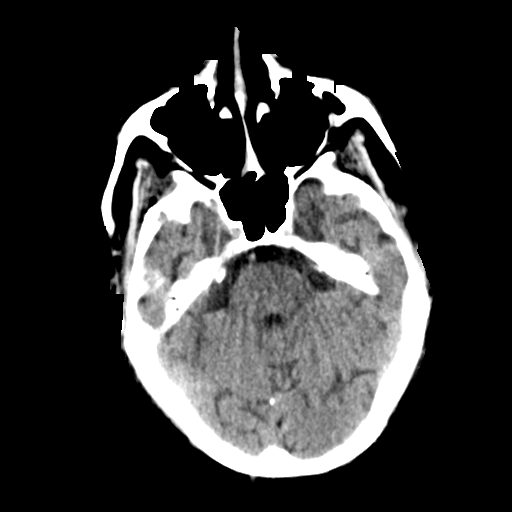
[im 14/34  brain]
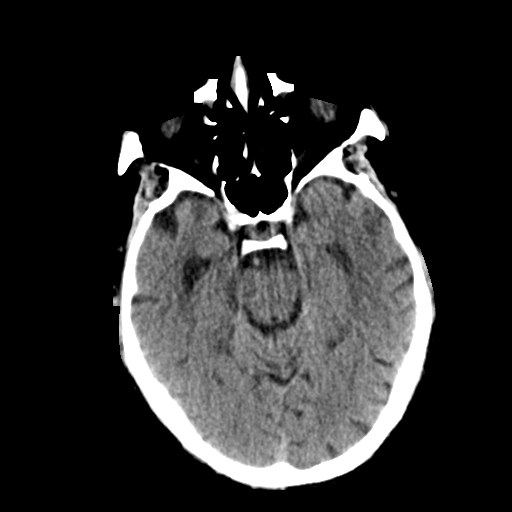
[im 16/34  brain]
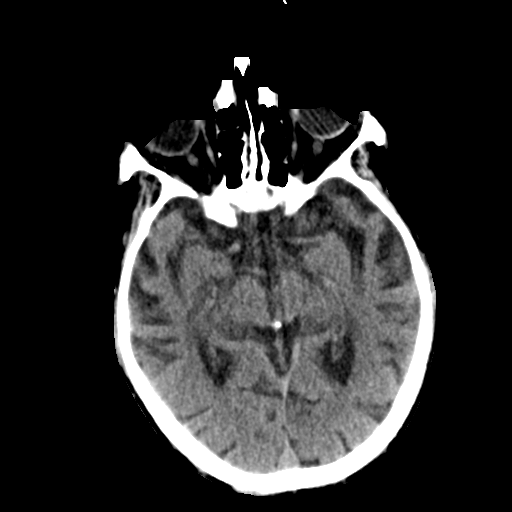
[im 18/34  brain]
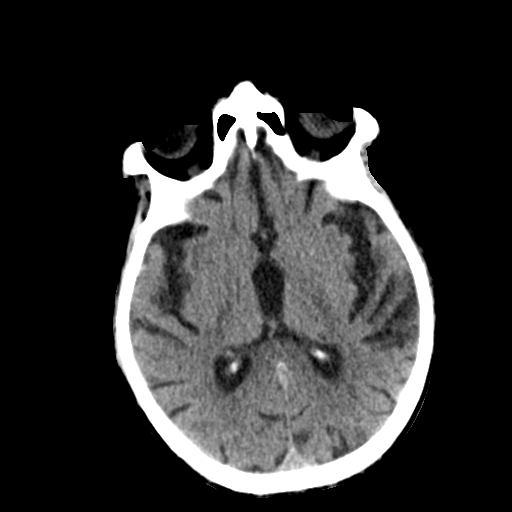
[im 18/34  bone]
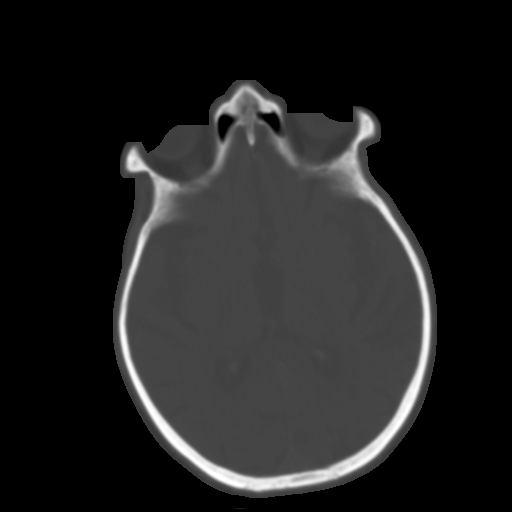
[im 20/34  brain]
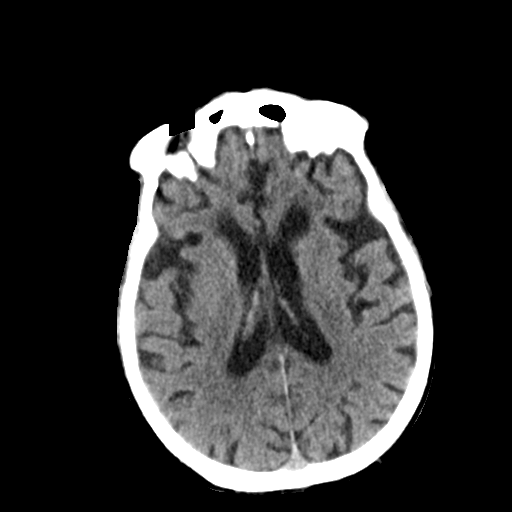
[im 22/34  brain]
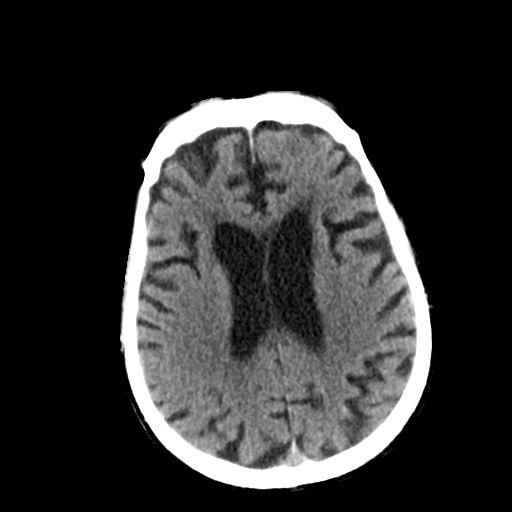
[im 24/34  brain]
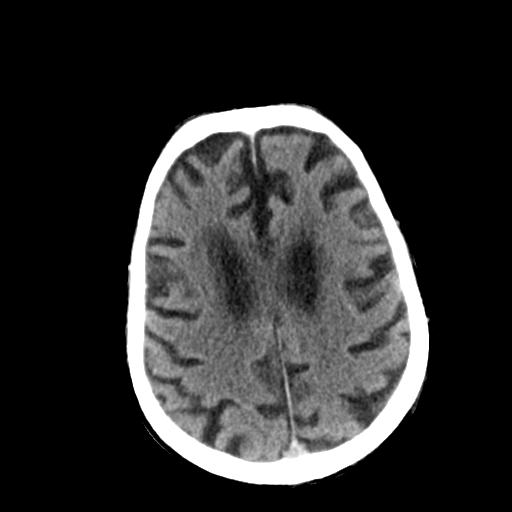
[im 26/34  brain]
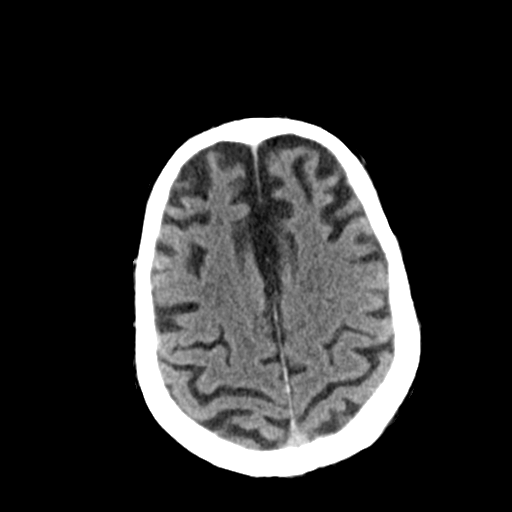
[im 26/34  bone]
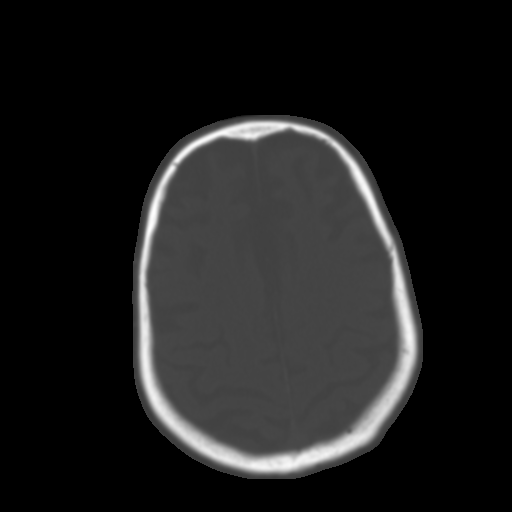
[im 28/34  brain]
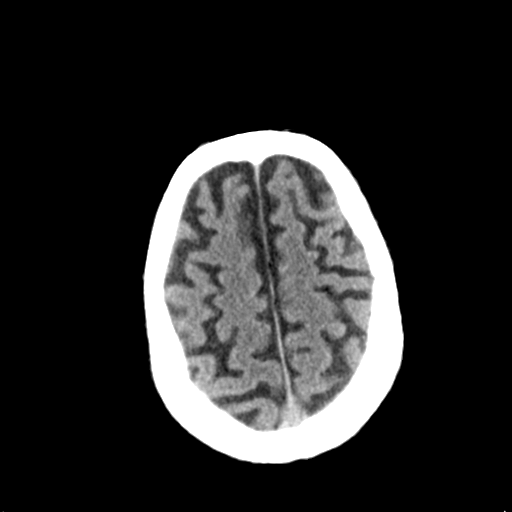
[im 30/34  brain]
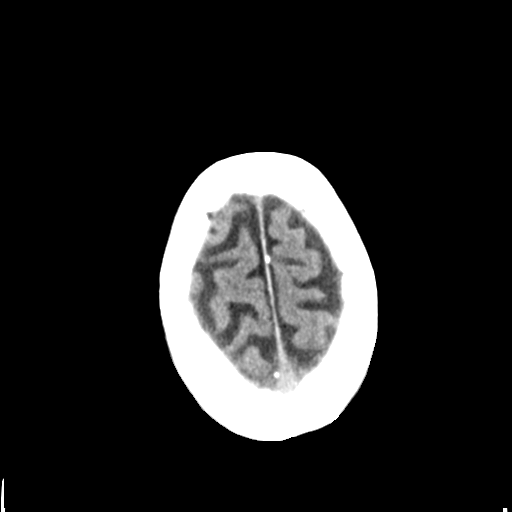
[im 32/34  brain]
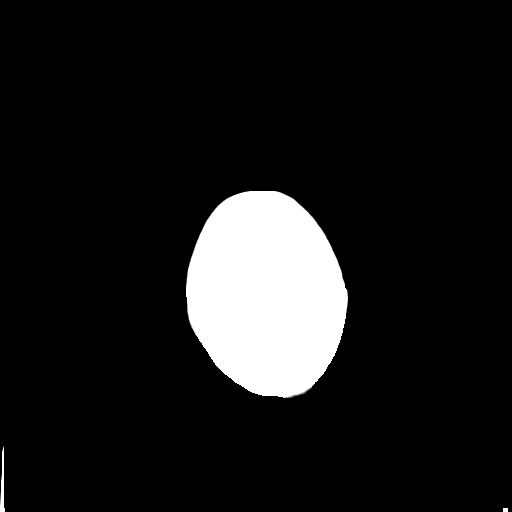

[16 of 30 positions shown; findings below may reference images not displayed]

FINDINGS: Progressive age related cerebral atrophy, ventriculomegaly and
periventricular white matter disease. No extra-axial fluid
collections. No CT findings for acute hemispheric infarction an or
intracranial hemorrhage. No mass lesions. The brainstem and
cerebellum are grossly normal.

No acute bony findings. Minimal scattered sinus disease. Scattered
mastoid effusions.
IMPRESSION: Age related cerebral atrophy, ventriculomegaly and periventricular
white matter disease.

No acute intracranial findings.

## 2015-03-15 IMAGING — CR DG CHEST 1V PORT
1 series · 1 of 1 positions shown · non-contrast
Comparison: 10/10/2006

CLINICAL DATA: Shortness of breath.

EXAM:
PORTABLE CHEST - 1 VIEW

[AP]
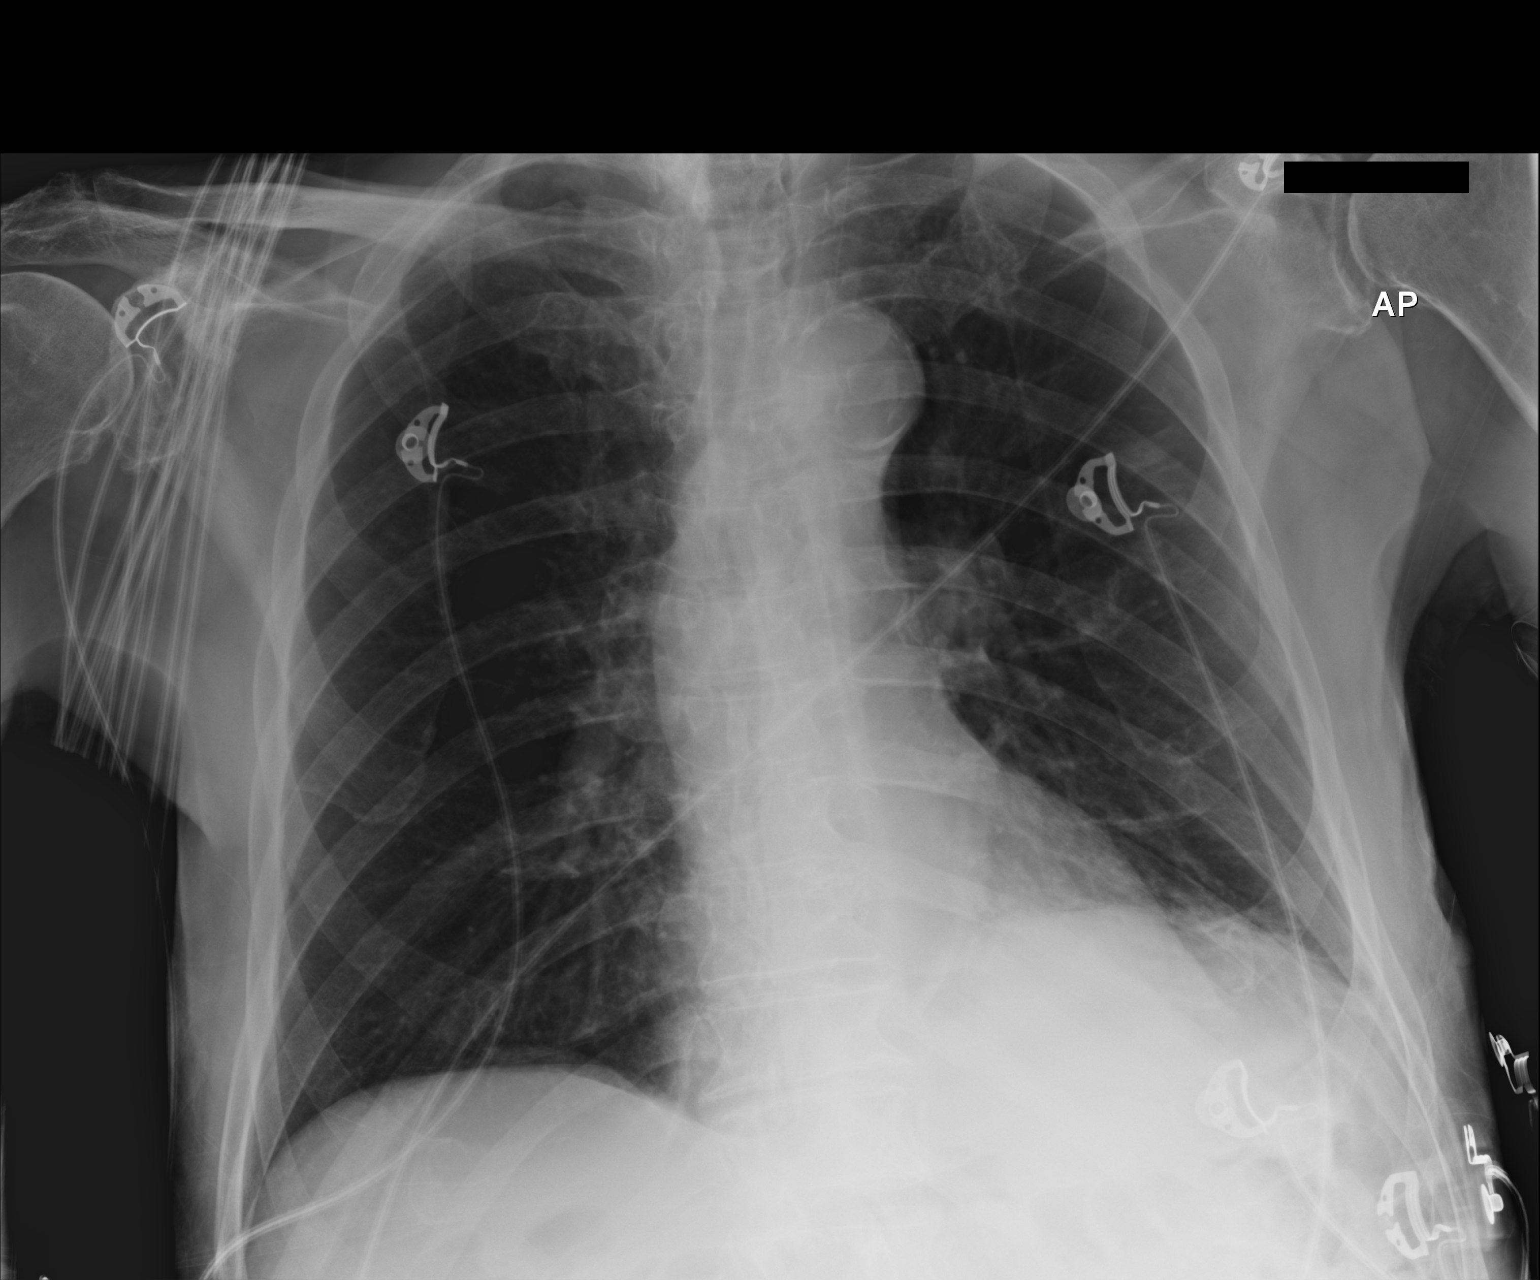

[1 of 1 positions shown; findings below may reference images not displayed]

FINDINGS: Heart size and pulmonary vascularity are normal. There is slight
elevation of the left hemidiaphragm with minimal atelectasis at the
left lung base. Right lung is clear. No effusions. No acute osseous
abnormality.
IMPRESSION: Slight atelectasis at the left lung base.
